# Patient Record
Sex: Female | Born: 1950 | Race: White | Hispanic: No | Marital: Married | Smoking: Never smoker
Health system: Southern US, Community
[De-identification: ages and names within clinical notes are randomized; demographics above are authoritative.]

## PROBLEM LIST (undated history)

## (undated) DIAGNOSIS — E785 Hyperlipidemia, unspecified: Secondary | ICD-10-CM

## (undated) DIAGNOSIS — N951 Menopausal and female climacteric states: Secondary | ICD-10-CM

## (undated) DIAGNOSIS — E559 Vitamin D deficiency, unspecified: Secondary | ICD-10-CM

## (undated) DIAGNOSIS — I10 Essential (primary) hypertension: Secondary | ICD-10-CM

## (undated) DIAGNOSIS — M8580 Other specified disorders of bone density and structure, unspecified site: Secondary | ICD-10-CM

## (undated) DIAGNOSIS — S82002A Unspecified fracture of left patella, initial encounter for closed fracture: Secondary | ICD-10-CM

## (undated) HISTORY — DX: Vitamin D deficiency, unspecified: E55.9

## (undated) HISTORY — DX: Hyperlipidemia, unspecified: E78.5

## (undated) HISTORY — PX: FEMUR FRACTURE SURGERY: SHX633

## (undated) HISTORY — DX: Other specified disorders of bone density and structure, unspecified site: M85.80

## (undated) HISTORY — DX: Essential (primary) hypertension: I10

## (undated) HISTORY — PX: HIP FRACTURE SURGERY: SHX118

## (undated) HISTORY — DX: Unspecified fracture of left patella, initial encounter for closed fracture: S82.002A

## (undated) HISTORY — PX: OTHER SURGICAL HISTORY: SHX169

## (undated) HISTORY — DX: Menopausal and female climacteric states: N95.1

---

## 2005-04-15 ENCOUNTER — Ambulatory Visit: Payer: Self-pay | Admitting: Internal Medicine

## 2005-04-23 ENCOUNTER — Other Ambulatory Visit: Payer: Self-pay

## 2005-04-23 ENCOUNTER — Inpatient Hospital Stay: Payer: Self-pay | Admitting: Unknown Physician Specialty

## 2005-09-30 ENCOUNTER — Ambulatory Visit: Payer: Self-pay | Admitting: Unknown Physician Specialty

## 2005-11-16 ENCOUNTER — Ambulatory Visit: Payer: Self-pay | Admitting: Unknown Physician Specialty

## 2006-06-02 ENCOUNTER — Ambulatory Visit: Payer: Self-pay | Admitting: Unknown Physician Specialty

## 2007-04-01 ENCOUNTER — Ambulatory Visit: Payer: Self-pay | Admitting: Physician Assistant

## 2007-07-03 ENCOUNTER — Ambulatory Visit: Payer: Self-pay | Admitting: Unknown Physician Specialty

## 2007-07-23 ENCOUNTER — Encounter: Payer: Self-pay | Admitting: Unknown Physician Specialty

## 2007-08-15 ENCOUNTER — Encounter: Payer: Self-pay | Admitting: Unknown Physician Specialty

## 2007-09-15 ENCOUNTER — Encounter: Payer: Self-pay | Admitting: Unknown Physician Specialty

## 2007-10-13 ENCOUNTER — Encounter: Payer: Self-pay | Admitting: Unknown Physician Specialty

## 2007-11-13 ENCOUNTER — Encounter: Payer: Self-pay | Admitting: Unknown Physician Specialty

## 2012-08-14 HISTORY — PX: COLONOSCOPY: SHX174

## 2012-09-09 LAB — HM COLONOSCOPY

## 2013-05-09 LAB — HM DEXA SCAN

## 2013-05-16 LAB — HM MAMMOGRAPHY

## 2014-08-31 ENCOUNTER — Encounter: Payer: Self-pay | Admitting: Podiatry

## 2014-08-31 ENCOUNTER — Ambulatory Visit (INDEPENDENT_AMBULATORY_CARE_PROVIDER_SITE_OTHER): Payer: BLUE CROSS/BLUE SHIELD

## 2014-08-31 ENCOUNTER — Ambulatory Visit (INDEPENDENT_AMBULATORY_CARE_PROVIDER_SITE_OTHER): Payer: BLUE CROSS/BLUE SHIELD | Admitting: Podiatry

## 2014-08-31 VITALS — BP 134/82 | HR 79 | Resp 16 | Ht 62.0 in | Wt 137.0 lb

## 2014-08-31 DIAGNOSIS — M722 Plantar fascial fibromatosis: Secondary | ICD-10-CM

## 2014-08-31 MED ORDER — MELOXICAM 15 MG PO TABS
15.0000 mg | ORAL_TABLET | Freq: Every day | ORAL | Status: DC
Start: 2014-08-31 — End: 2014-12-21

## 2014-08-31 MED ORDER — METHYLPREDNISOLONE (PAK) 4 MG PO TABS: ORAL_TABLET | ORAL | Status: DC

## 2014-08-31 NOTE — Progress Notes (Signed)
   Subjective:    Patient ID: Carmen Clarke, female    DOB: 18-Dec-1950, 64 y.o.   MRN: [id number]  HPI Comments: Heel pain in the left foot since the end of September , thought it was due to walking on the treadmill so i stopped that and it has not got any better , it is starting to effect the way i walk      Review of Systems  Musculoskeletal:       Joint pain   All other systems reviewed and are negative.      Objective:   Physical Exam : I have reviewed her past medical history medications allergy surgery social history and review of systems. Pulses are strongly palpable bilateral neurologic sensorium is intact percent was C monofilament. Deep tendon reflexes are intact bilaterally muscle strength is 5 over 5 dorsiflexion plantar flexors and inverters everters all into the musculature is intact. Orthopedic evaluation is resolved with system to the ankle for range of motion or crepitation. Pain on palpation medial calcaneal tubercle of the left heel is indicative of plantar fasciitis. As is the soft tissue increase in density at plantar fascia calcaneal insertion site.       Assessment & Plan:  Assessment: Plantar fasciitis left.  Plan: Started her on a Medrol Dosepak to be followed by meloxicam. Injected the left heel today. Placed her in a plantar fascial brace and a plantar fascial night splint. Discussed appropriate shoe gear stretching exercises and ice therapy. Injected the left heel today with Kenalog and local anesthetic follow-up with her 1 month

## 2014-08-31 NOTE — Patient Instructions (Signed)

## 2014-09-28 ENCOUNTER — Ambulatory Visit: Payer: BLUE CROSS/BLUE SHIELD | Admitting: Podiatry

## 2014-10-07 ENCOUNTER — Ambulatory Visit (INDEPENDENT_AMBULATORY_CARE_PROVIDER_SITE_OTHER): Payer: BLUE CROSS/BLUE SHIELD | Admitting: Podiatry

## 2014-10-07 ENCOUNTER — Encounter: Payer: Self-pay | Admitting: Podiatry

## 2014-10-07 DIAGNOSIS — M722 Plantar fascial fibromatosis: Secondary | ICD-10-CM

## 2014-10-07 NOTE — Progress Notes (Signed)
She presents today for follow-up of her plantar fasciitis to her left heel. She states that she is approximately 98% improved. She is also concerned about bunion to her left foot.  Objective: Vital signs are stable she is alert and oriented 3. She states that she has gone on to purchase a a new pair of shoes which is made a significant difference in her ambulation. She continues to wear her night splint per day brace and continue to take her anti-inflammatories.  Assessment: Well-healing plantar fasciitis left foot. Hallux valgus deformity left foot. With contracted second metatarsophalangeal joint left foot.  Plan: Discussed etiology pathology conservative versus surgical therapies for the hallux valgus deformity and hammertoe deformity second left. We also discussed the possible need for an endoscopic fasciotomy should be fasciitis ever recur. At this point I encouraged her to continue all conservative therapies and this should resolve her symptoms. I did suggest that she continue to change shoe gear approximately every 4-5 months.

## 2014-11-02 ENCOUNTER — Ambulatory Visit (INDEPENDENT_AMBULATORY_CARE_PROVIDER_SITE_OTHER): Payer: BLUE CROSS/BLUE SHIELD | Admitting: Podiatry

## 2014-11-02 VITALS — BP 139/83 | HR 79 | Resp 16

## 2014-11-02 DIAGNOSIS — M722 Plantar fascial fibromatosis: Secondary | ICD-10-CM | POA: Diagnosis not present

## 2014-11-02 NOTE — Progress Notes (Signed)
She presents today for follow-up of her plantar fasciitis. She states that over did it the other day with the treadmill. She states that now back to feeling 90% better however the treadmill really bothered my foot. It felt like it did when I first came to see you. But it did resolve once again.  Objective: Vital signs are stable she is alert and oriented 3 no reproducible pain to the left foot today.  Assessment: Plantar fasciitis  Plan: Continue all conservative therapies.

## 2014-11-25 ENCOUNTER — Ambulatory Visit
Admit: 2014-11-25 | Disposition: A | Discharge status: Home / Self Care | Payer: Self-pay | Attending: Family Medicine | Admitting: Family Medicine

## 2014-12-21 ENCOUNTER — Ambulatory Visit (INDEPENDENT_AMBULATORY_CARE_PROVIDER_SITE_OTHER): Payer: BLUE CROSS/BLUE SHIELD | Admitting: Podiatry

## 2014-12-21 ENCOUNTER — Encounter: Payer: Self-pay | Admitting: Podiatry

## 2014-12-21 VITALS — BP 113/72 | HR 75 | Resp 16

## 2014-12-21 DIAGNOSIS — M722 Plantar fascial fibromatosis: Secondary | ICD-10-CM | POA: Diagnosis not present

## 2014-12-22 NOTE — Progress Notes (Signed)
She presents today for follow-up of her plantar fasciitis left foot. She states that it started hurting again.  Objective: Vital signs are stable she's alert and oriented 3. She is a patient may continue to removal of the left heel. Pulses are palpable pain.  Assessment: Intractable plantar fasciitis left foot.  Plan: Discussed etiology pathology conservative or surgical therapies discussed appropriate shoe gear stretching exercises ice therapy sugar modifications and nonsteroidal anti-inflammatory use. We also injected the left heel today with Kenalog and local anesthetic I will follow-up with her 1 month.

## 2015-01-20 ENCOUNTER — Ambulatory Visit: Payer: BLUE CROSS/BLUE SHIELD | Admitting: Podiatry

## 2015-01-25 ENCOUNTER — Ambulatory Visit (INDEPENDENT_AMBULATORY_CARE_PROVIDER_SITE_OTHER): Payer: BLUE CROSS/BLUE SHIELD | Admitting: Podiatry

## 2015-01-25 DIAGNOSIS — M722 Plantar fascial fibromatosis: Secondary | ICD-10-CM

## 2015-01-25 MED ORDER — DICLOFENAC SODIUM 75 MG PO TBEC: 75.0000 mg | DELAYED_RELEASE_TABLET | Freq: Two times a day (BID) | ORAL | Status: DC

## 2015-01-25 NOTE — Progress Notes (Signed)
She presents today for follow-up of her plantar fasciitis. She states that this point is not getting any better and seems to be worse than it was initially.  Objective: Vital signs are stable she is alert and oriented 3 pulses are palpable left. No Pain. She has pain on palpation medial calcaneal tubercle of the left heel.  Assessment: Chronic intractable plantar fasciitis left heel.  Plan: Injected her left heel today changed her meloxicam to diclofenac and she was scanned for some orthotics.

## 2015-01-29 ENCOUNTER — Encounter: Payer: Self-pay | Admitting: *Deleted

## 2015-01-29 NOTE — Progress Notes (Signed)
Received fax from [facility] regarding pt medications-patient has D/C meloxicam and taking diclofenac currently. Information was completed and faxed back.

## 2015-02-04 ENCOUNTER — Ambulatory Visit: Payer: Self-pay

## 2015-02-09 ENCOUNTER — Ambulatory Visit: Payer: Self-pay

## 2015-02-16 ENCOUNTER — Ambulatory Visit: Payer: Self-pay

## 2015-03-08 ENCOUNTER — Ambulatory Visit: Payer: BLUE CROSS/BLUE SHIELD | Admitting: Podiatry

## 2015-03-11 ENCOUNTER — Ambulatory Visit (INDEPENDENT_AMBULATORY_CARE_PROVIDER_SITE_OTHER): Payer: BLUE CROSS/BLUE SHIELD | Admitting: *Deleted

## 2015-03-11 DIAGNOSIS — M722 Plantar fascial fibromatosis: Secondary | ICD-10-CM

## 2015-03-11 NOTE — Progress Notes (Signed)
Orthotics dispensed.   Instructions given. Recheck in 1 month, if needed.

## 2015-03-11 NOTE — Patient Instructions (Signed)

## 2015-03-15 ENCOUNTER — Encounter: Payer: Self-pay | Admitting: Podiatry

## 2015-03-15 ENCOUNTER — Ambulatory Visit (INDEPENDENT_AMBULATORY_CARE_PROVIDER_SITE_OTHER): Payer: BLUE CROSS/BLUE SHIELD | Admitting: Podiatry

## 2015-03-15 VITALS — BP 126/68 | HR 72 | Resp 16

## 2015-03-15 DIAGNOSIS — M722 Plantar fascial fibromatosis: Secondary | ICD-10-CM | POA: Diagnosis not present

## 2015-03-15 DIAGNOSIS — M779 Enthesopathy, unspecified: Secondary | ICD-10-CM

## 2015-03-15 DIAGNOSIS — M778 Other enthesopathies, not elsewhere classified: Secondary | ICD-10-CM

## 2015-03-15 DIAGNOSIS — M7752 Other enthesopathy of left foot: Secondary | ICD-10-CM | POA: Diagnosis not present

## 2015-03-15 HISTORY — PX: FOOT SURGERY: SHX648

## 2015-03-15 MED ORDER — METHYLPREDNISOLONE 4 MG PO TBPK: ORAL_TABLET | ORAL | Status: DC

## 2015-03-15 NOTE — Progress Notes (Signed)
She presents today for follow-up of her plantar fasciitis left. She states that she's having some sharp pains across the dorsal aspect and the dorsal lateral aspect of her left foot.  Objective: Vital signs are stable she is alert and oriented 3 pulses are palpable left. Pain on palpation medial calcaneal tubercle of the left heel. Pain on palpation of the dorsal lateral aspect of the left foot overlying the fourth and fifth met cuboid articulation.  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  Plan: Injected the dorsal lateral aspect of left foot overlying the fourth and fifth met cuboid articulation. She will continue all other conservative therapies for the plantar fasciitis.

## 2015-04-05 ENCOUNTER — Encounter: Payer: Self-pay | Admitting: Podiatry

## 2015-04-05 ENCOUNTER — Ambulatory Visit (INDEPENDENT_AMBULATORY_CARE_PROVIDER_SITE_OTHER): Payer: BLUE CROSS/BLUE SHIELD | Admitting: Podiatry

## 2015-04-05 VITALS — BP 142/96 | HR 78 | Resp 12

## 2015-04-05 DIAGNOSIS — M722 Plantar fascial fibromatosis: Secondary | ICD-10-CM | POA: Diagnosis not present

## 2015-04-05 NOTE — Progress Notes (Signed)
She presents today for surgical consult. She states that she recently returned from [place]. She states that the last injection and Medrol Dosepak really didn't seem to help very much at all. She denies changes in her past medical history medications allergies surgery or social history. Continued pain to the plantar left heel as well as to the plantar lateral aspect and dorsal lateral aspect of the left foot.  Objective: Vital signs are stable she is alert and oriented 3. Pulses are palpable left foot. No calf pain. Muscle strength is normal left. Neurologic sensorium is intact left. She has pain all frontal plane range of motion over Lisfranc's joints particularly overlying the fourth and fifth metatarsal cuboid articulation. She also has pain on palpation medial calcaneal tubercle of the left heel. Previous radiographs taken in the office were reevaluated today demonstrating at the time soft tissue increase in density at the plantar fascial calcaneal insertion site. I see no signs of other complications.  Assessment: 64 year old female with a history of chronic proximal plantar fasciitis and lateral compensatory syndrome worsening.  Plan: We discussed the etiology pathology conservative versus surgical therapies. At this point she is requesting surgical intervention. We went over a consent form today line by line number by number giving her ample time to ask questions she saw fit regarding an endoscopic plantar fasciotomy to the left foot. I answered all the questions regarding these procedures to the best of my ability in layman's terms. She understood it was amenable to it and sign off 3 pages of the consent form. We did discuss the possible postop complications which may include but are not limited to postoperative pain bleeding swelling infection recurrence and need for further surgery. Cam Walker was dispensed today for her postop recovery. Should she have questions or concerns regarding these  procedures she will notify us immediately.

## 2015-04-05 NOTE — Patient Instructions (Signed)
Pre-Operative Instructions  Congratulations, you have decided to take an important step to improving your quality of life.  You can be assured that the doctors of [facility] will be with you every step of the way.  1. Plan to be at the surgery center/hospital at least 1 (one) hour prior to your scheduled time unless otherwise directed by the surgical center/hospital staff.  You must have a responsible adult accompany you, remain during the surgery and drive you home.  Make sure you have directions to the surgical center/hospital and know how to get there on time. 2. For hospital based surgery you will need to obtain a history and physical form from your family physician within 1 month prior to the date of surgery- we will give you a form for you primary physician.  3. We make every effort to accommodate the date you request for surgery.  There are however, times where surgery dates or times have to be moved.  We will contact you as soon as possible if a change in schedule is required.   4. No Aspirin/Ibuprofen for one week before surgery.  If you are on aspirin, any non-steroidal anti-inflammatory medications (Mobic, Aleve, Ibuprofen) you should stop taking it 7 days prior to your surgery.  You make take Tylenol  For pain prior to surgery.  5. Medications- If you are taking daily heart and blood pressure medications, seizure, reflux, allergy, asthma, anxiety, pain or diabetes medications, make sure the surgery center/hospital is aware before the day of surgery so they may notify you which medications to take or avoid the day of surgery. 6. No food or drink after midnight the night before surgery unless directed otherwise by surgical center/hospital staff. 7. No alcoholic beverages 24 hours prior to surgery.  No smoking 24 hours prior to or 24 hours after surgery. 8. Wear loose pants or shorts- loose enough to fit over bandages, boots, and casts. 9. No slip on shoes, sneakers are best. 10. Bring  your boot with you to the surgery center/hospital.  Also bring crutches or a walker if your physician has prescribed it for you.  If you do not have this equipment, it will be provided for you after surgery. 11. If you have not been contracted by the surgery center/hospital by the day before your surgery, call to confirm the date and time of your surgery. 12. Leave-time from work may vary depending on the type of surgery you have.  Appropriate arrangements should be made prior to surgery with your employer. 13. Prescriptions will be provided immediately following surgery by your doctor.  Have these filled as soon as possible after surgery and take the medication as directed. 14. Remove nail polish on the operative foot. 15. Wash the night before surgery.  The night before surgery wash the foot and leg well with the antibacterial soap provided and water paying special attention to beneath the toenails and in between the toes.  Rinse thoroughly with water and dry well with a towel.  Perform this wash unless told not to do so by your physician.  Enclosed: 1 Ice pack (please put in freezer the night before surgery)   1 Hibiclens skin cleaner   Pre-op Instructions  If you have any questions regarding the instructions, do not hesitate to call our office.  [place]: [address] [place], [place] [address] [phone]  [place]: [address] [place], [place] [address] [phone]  [place]: [address]  [place], [place] [address] [phone]  Dr. Richard   Tuchman DPM, Dr. Norman Regal DPM Dr. Richard Sikora DPM, Dr. M. Todd Hyatt DPM, Dr. Kathryn Egerton DPM 

## 2015-04-07 ENCOUNTER — Telehealth: Payer: Self-pay | Admitting: *Deleted

## 2015-04-07 NOTE — Telephone Encounter (Signed)
"  I was in the [place] office Monday to see Dr.Hyatt.  He discussed surgery with me for my left heel.  I filled out the on-line registration Monday but I didn't know whether I was to wait until you called me or if I'm supposed to call you.  Anyway, I'm just trying to find out what I'm supposed to do now and get my surgery scheduled.  Call me back after you get this message.  After 1pm, call me on my cell phone.  Thank you."  I attempted to call patient at home number.  I left her a message to call me back.  I called her mobile and got her.  I'm returning your call to schedule surgery.  When would you like to do it?  "I'd like to do it as soon as possible because I have a trip planned to go to the beach in October."  He can do it on 04/16/2015.  "That will be perfect."  Surgical center will call you a day or two prior to and give you arrival time.  "Okay, thank you."

## 2015-04-12 ENCOUNTER — Telehealth: Payer: Self-pay | Admitting: *Deleted

## 2015-04-12 NOTE — Telephone Encounter (Addendum)
Pt states she forgot to ask if she were to stop the antiinflammatory medication prior to her surgery 04/16/2015, but she went ahead and did. I told pt it would be fine to stay off the antiinflammatory, I would advise Dr. Milinda Pointer and if changes call again.  Left message informing pt, Dr. Milinda Pointer stated okay for stopping the antiinflammatory medication.

## 2015-04-12 NOTE — Telephone Encounter (Signed)
Ok....it really doesn't matter.

## 2015-04-15 ENCOUNTER — Other Ambulatory Visit: Payer: Self-pay | Admitting: Podiatry

## 2015-04-15 MED ORDER — OXYCODONE-ACETAMINOPHEN 10-325 MG PO TABS: 1.0000 | ORAL_TABLET | Freq: Four times a day (QID) | ORAL | Status: DC | PRN

## 2015-04-15 MED ORDER — PROMETHAZINE HCL 25 MG PO TABS: 25.0000 mg | ORAL_TABLET | Freq: Three times a day (TID) | ORAL | Status: DC | PRN

## 2015-04-15 MED ORDER — CEPHALEXIN 500 MG PO CAPS: 500.0000 mg | ORAL_CAPSULE | Freq: Three times a day (TID) | ORAL | Status: DC

## 2015-04-16 ENCOUNTER — Encounter: Payer: Self-pay | Admitting: *Deleted

## 2015-04-16 DIAGNOSIS — M722 Plantar fascial fibromatosis: Secondary | ICD-10-CM | POA: Diagnosis not present

## 2015-04-21 ENCOUNTER — Telehealth: Payer: Self-pay | Admitting: *Deleted

## 2015-04-21 ENCOUNTER — Ambulatory Visit (INDEPENDENT_AMBULATORY_CARE_PROVIDER_SITE_OTHER): Payer: BLUE CROSS/BLUE SHIELD | Admitting: Podiatry

## 2015-04-21 ENCOUNTER — Encounter: Payer: Self-pay | Admitting: Podiatry

## 2015-04-21 VITALS — BP 138/78 | HR 88 | Resp 16

## 2015-04-21 DIAGNOSIS — M722 Plantar fascial fibromatosis: Secondary | ICD-10-CM

## 2015-04-21 DIAGNOSIS — Z9889 Other specified postprocedural states: Secondary | ICD-10-CM

## 2015-04-21 NOTE — Progress Notes (Signed)
Surgery performed at [facility] for an Endoscopic Plantar Fasciotomy left foot.

## 2015-04-21 NOTE — Progress Notes (Signed)
She presents today 1 week status post endoscopic plantar fasciotomy left foot. She states that I have had very little pain. She states that the local anesthesia a pain block lasted 26 hours. She denies fever chills nausea vomiting muscle aches and pains.  Objective: Vital signs are stable she is alert and oriented 3. Pulses are strongly palpable. Dry sterile dressing was removed demonstrates sutures are in place margins well coapted there is no bleeding on the bandage. I see no signs of infection in that there is no erythema edema cellulitis drainage or odor.  Assessment: Well-healing surgical foot left status post EPF 1 week.  Plan: Encouraged range of motion exercises however she is to continue to keep her foot in the cam walker as much as possible. I redressed today with simple Band-Aids and a well will allow her to get this wet be a shower and then soak in Epsom salts and warm water afterwards. I will follow-up with her in 1 week a compression anklet was dispensed.

## 2015-04-21 NOTE — Telephone Encounter (Signed)
I attempted to call patient to see how she was doing following surgery.  I left her a message that she can call me back if she likes.

## 2015-04-22 ENCOUNTER — Telehealth: Payer: Self-pay | Admitting: *Deleted

## 2015-04-22 ENCOUNTER — Encounter: Payer: Self-pay | Admitting: Podiatry

## 2015-04-22 NOTE — Telephone Encounter (Signed)
ok 

## 2015-04-22 NOTE — Telephone Encounter (Signed)
Pt states at [place] Dr. Milinda Pointer applied compression sock, and she soaked the foot then put the compression sock BACK ON, at 100am she began to have excruciating pain in the heel, and soaked again this morning but left the compression sock off.  I instructed pt to leave off the compression sock, but to apply an ace wrap from toes to up calf to decrease any swelling and to begin training the foot not to swell during the day, then begin the soak after her evening bath and NOT put on the compression sock, UNTIL FIRST thing in the morning before swinging leg over the edge of the bed.  I told pt to continue to sleep in the boot until otherwise directed, and to wash the compression sock and white stockingette at night and air dry.

## 2015-04-28 ENCOUNTER — Ambulatory Visit (INDEPENDENT_AMBULATORY_CARE_PROVIDER_SITE_OTHER): Payer: BLUE CROSS/BLUE SHIELD | Admitting: Podiatry

## 2015-04-28 ENCOUNTER — Encounter: Payer: Self-pay | Admitting: Podiatry

## 2015-04-28 VITALS — BP 136/79 | HR 76 | Resp 16

## 2015-04-28 DIAGNOSIS — Z9889 Other specified postprocedural states: Secondary | ICD-10-CM

## 2015-04-28 DIAGNOSIS — M722 Plantar fascial fibromatosis: Secondary | ICD-10-CM

## 2015-04-28 NOTE — Progress Notes (Signed)
She presents today for follow-up of her endoscopic plantar fasciotomy of her left foot. She states has been very tender. She is 2 weeks now status post EPF left. Sutures are intact and margins are well coapted.  Objective: Tenderness on palpation medial calcaneal tubercle minimal edema no erythema cellulitis drainage or odor. Sutures are intact and margins are well coapted we removed the sutures today no signs of purulence and no drainage.  Assessment: Well-healing surgical foot left.  Plan: Discontinue use of the anklet. Discontinue use of the Cam Walker during the day. Start utilizing her tennis shoes with her insoles. She will continue the use of the night splint 1 month. I will follow-up with her in 2 weeks.

## 2015-05-07 DIAGNOSIS — E785 Hyperlipidemia, unspecified: Secondary | ICD-10-CM | POA: Insufficient documentation

## 2015-05-07 DIAGNOSIS — M8580 Other specified disorders of bone density and structure, unspecified site: Secondary | ICD-10-CM | POA: Insufficient documentation

## 2015-05-07 DIAGNOSIS — E559 Vitamin D deficiency, unspecified: Secondary | ICD-10-CM | POA: Insufficient documentation

## 2015-05-07 DIAGNOSIS — I10 Essential (primary) hypertension: Secondary | ICD-10-CM | POA: Insufficient documentation

## 2015-05-12 ENCOUNTER — Encounter: Payer: Self-pay | Admitting: Podiatry

## 2015-05-12 ENCOUNTER — Ambulatory Visit (INDEPENDENT_AMBULATORY_CARE_PROVIDER_SITE_OTHER): Payer: BLUE CROSS/BLUE SHIELD | Admitting: Podiatry

## 2015-05-12 VITALS — BP 128/92 | HR 88 | Resp 18

## 2015-05-12 DIAGNOSIS — M722 Plantar fascial fibromatosis: Secondary | ICD-10-CM

## 2015-05-12 DIAGNOSIS — Z9889 Other specified postprocedural states: Secondary | ICD-10-CM

## 2015-05-12 DIAGNOSIS — L309 Dermatitis, unspecified: Secondary | ICD-10-CM

## 2015-05-12 NOTE — Progress Notes (Signed)
She presents today 4 weeks status post endoscopic plantar fasciotomy left foot. Initially the foot felt much better and now she states that her stiffness is better as it did prior to surgery. She states this seems to hurt worse right incision site. She relates that she's been rubbing and massaging the foot with a [employer] hand cream and now has started to develop a rash and peeling the plantar aspect of the left foot only. She states the small red rash on the second toe of the left foot.  Objective: Vital signs are stable she is alert and oriented 3 pulses are palpable bilateral. As to the rash she has a rash majority plantar aspect of the left foot however she does have a rash with peeling starting on the plantar aspect of the right foot more than likely this is associated with the the lotion that she is putting on the foot because it's primarily on the left. That she does that she puts it on the right on occasions as well. She also demonstrates some petechial type lesions much like an athlete's foot more on the left than on the right. This also could be another etiology. She has tenderness on palpation of medial calcaneal tubercle of the left foot but there is still some scarring present and the majority of the symptoms are at the level of the incision site. She states that she took her orthotics out of her tennis shoes.  Assessment: Endoscopic plantar fasciotomy 1 month ago. Tinea pedis with contact dermatitis bilateral foot. Residual pain status post EPF left.  Plan: I encouraged her to continue to rub the foot however use a regular hand cream I encouraged her to stop using the cream that she was using and start utilizing a Lamisil AT cream twice daily. I encouraged her to continue her diclofenac 75 mg twice daily. Follow-up with her in 2-4 weeks  Carmen Clarke DPM

## 2015-05-14 ENCOUNTER — Ambulatory Visit (INDEPENDENT_AMBULATORY_CARE_PROVIDER_SITE_OTHER): Payer: BLUE CROSS/BLUE SHIELD | Admitting: Family Medicine

## 2015-05-14 ENCOUNTER — Encounter: Payer: Self-pay | Admitting: Family Medicine

## 2015-05-14 VITALS — BP 129/83 | HR 68 | Temp 98.2°F | Ht 62.0 in | Wt 140.0 lb

## 2015-05-14 DIAGNOSIS — E559 Vitamin D deficiency, unspecified: Secondary | ICD-10-CM

## 2015-05-14 DIAGNOSIS — Z23 Encounter for immunization: Secondary | ICD-10-CM | POA: Diagnosis not present

## 2015-05-14 DIAGNOSIS — M8580 Other specified disorders of bone density and structure, unspecified site: Secondary | ICD-10-CM | POA: Diagnosis not present

## 2015-05-14 DIAGNOSIS — I10 Essential (primary) hypertension: Secondary | ICD-10-CM

## 2015-05-14 DIAGNOSIS — E785 Hyperlipidemia, unspecified: Secondary | ICD-10-CM

## 2015-05-14 DIAGNOSIS — Z0000 Encounter for general adult medical examination without abnormal findings: Secondary | ICD-10-CM | POA: Diagnosis not present

## 2015-05-14 DIAGNOSIS — E663 Overweight: Secondary | ICD-10-CM | POA: Insufficient documentation

## 2015-05-14 DIAGNOSIS — Z1239 Encounter for other screening for malignant neoplasm of breast: Secondary | ICD-10-CM | POA: Diagnosis not present

## 2015-05-14 NOTE — Assessment & Plan Note (Signed)
Order DEXA; continue vit D supplementation and adequate calcium intake 1000 to 1200 mg daily; fall precautions

## 2015-05-14 NOTE — Progress Notes (Signed)
Patient ID: Carmen Clarke, female   DOB: 1950/08/24, 64 y.o.   MRN: [id number]  Subjective:   Carmen Clarke is a 64 y.o. female here for a complete physical exam  Interim issues since last visit: had foot surgery, Dr. Milinda Pointer  USPSTF grade A and B recommendations Alcohol: n/a Depression screen [facility] 2/9 05/14/2015  Decreased Interest 0  Down, Depressed, Hopeless 0  PHQ - 2 Score 0  HTN: controlled Obesity: just 0.6 above 25 (overweight); weight gain 5 pounds over last 6 months; foot surgery Tobacco: n/a HIV, hep B, hep C: politely declined BRCA: n/a Abuse: no Cervical cancer screening: UTD Lipids and glucose: today Colonoscopy: jan 2014, patient thinks 10 year pass Breast cancer: mammo order Lung ca: n/a Osteoporosis: DEXA ordered AAA: n/a Aspirin: not currently taking Fall prevention: dicscussed Diet: not typical American eater; fried chicken once every 3 months, takes the skin off; not much cheese; eats steak, red meat less than 2x a week Exercise: had foot surgery, does what she can Skin cancer: avoid excessive sun exposure  Past Medical History  Diagnosis Date  . Hypertension   . Hyperlipidemia   . Osteopenia   . Vitamin D deficiency disease   . Menopausal symptoms     previous HRT   Past Surgical History  Procedure Laterality Date  . Femur fracture surgery    . Hip fracture surgery    . Knee arthoscopy Left     partial medial and lateral meniscetomy with removal of deep hardware from left distal femur  . Foot surgery  Aug. 2016   Family History  Problem Relation Age of Onset  . Diabetes Mother   . Hypertension Mother   . Hypertension Father   No thyroid disease in the family  Social History  Substance Use Topics  . Smoking status: Never Smoker   . Smokeless tobacco: Never Used  . Alcohol Use: No   Review of Systems  Constitutional: Positive for unexpected weight change (five pounds over last 6 months).  HENT: Negative for hearing loss.   Eyes:  Negative for visual disturbance.  Respiratory: Negative for shortness of breath.   Cardiovascular: Negative for chest pain.  Gastrointestinal: Negative for constipation and blood in stool.  Endocrine: Positive for heat intolerance. Negative for polydipsia.       Still has occasional hot flashes  Genitourinary: Negative for hematuria.  Musculoskeletal:       Foot pain  Skin:       No hair loss; no dry skin; no moles have changed; sees derm, checks her thoroughly she says  Allergic/Immunologic: Negative for food allergies.  Neurological: Negative for tremors.  Hematological: Negative for adenopathy. Does not bruise/bleed easily.  Psychiatric/Behavioral: Negative for dysphoric mood.    Objective:   Filed Vitals:   05/14/15 0853  BP: 129/83  Pulse: 68  Temp: 98.2 F (36.8 C)  Height: _0  (1.575 m)  Weight: 140 lb (63.504 kg)  SpO2: 100%   Body mass index is 25.6 kg/(m^2). Wt Readings from Last 3 Encounters:  05/14/15 140 lb (63.504 kg)  12/18/14 139 lb (63.05 kg)  08/31/14 137 lb (62.143 kg)   Physical Exam  Constitutional: She appears well-developed and well-nourished.  HENT:  Head: Normocephalic and atraumatic.  Right Ear: Hearing, tympanic membrane, external ear and ear canal normal.  Left Ear: Hearing, tympanic membrane, external ear and ear canal normal.  Eyes: Conjunctivae and EOM are normal. Right eye exhibits no hordeolum. Left eye exhibits no hordeolum. No  scleral icterus.  Neck: Carotid bruit is not present. No thyromegaly present.  Cardiovascular: Normal rate, regular rhythm, S1 normal, S2 normal and normal heart sounds.   No extrasystoles are present.  Pulmonary/Chest: Effort normal and breath sounds normal. No respiratory distress. Right breast exhibits no inverted nipple, no mass, no nipple discharge, no skin change and no tenderness. Left breast exhibits no inverted nipple, no mass, no nipple discharge, no skin change and no tenderness. Breasts are  symmetrical.  Abdominal: Soft. Normal appearance and bowel sounds are normal. She exhibits no distension, no abdominal bruit, no pulsatile midline mass and no mass. There is no hepatosplenomegaly. There is no tenderness. No hernia.  Musculoskeletal: Normal range of motion. She exhibits no edema.  Lymphadenopathy:       Head (right side): No submandibular adenopathy present.       Head (left side): No submandibular adenopathy present.    She has no cervical adenopathy.    She has no axillary adenopathy.  Neurological: She is alert. She displays no tremor. No cranial nerve deficit. She exhibits normal muscle tone. Gait normal.  Reflex Scores:      Patellar reflexes are 2+ on the right side and 2+ on the left side. Skin: Skin is warm and dry. No bruising and no ecchymosis noted. No cyanosis. No pallor.  Psychiatric: Her speech is normal and behavior is normal. Thought content normal. Her mood appears not anxious. She does not exhibit a depressed mood.    Assessment/Plan:   Problem List Items Addressed This Visit      Cardiovascular and Mediastinum   Hypertension    Continue med; controlled today        Musculoskeletal and Integument   Osteopenia    Order DEXA; continue vit D supplementation and adequate calcium intake 1000 to 1200 mg daily; fall precautions      Relevant Orders   DG Bone Density     Other   Hyperlipidemia    Check lipids, limit saturated fats; continue statin      Vitamin D deficiency disease    Check level today and continue supplement      Relevant Orders   Vit D  25 hydroxy (rtn osteoporosis monitoring) (Completed)   Overweight (BMI 25.0-29.9)    Very modestly overweight; goal BMI is less than 25      Relevant Orders   TSH (Completed)   Breast cancer screening    Mammograms every 1-2 years, SBE monthly, CBE done      Relevant Orders   MM DIGITAL SCREENING BILATERAL   Preventative health care - Primary    USPSTF grade A and B recommendations  reviewed with patient; age-appropriate recommendations, preventive care, screening tests, etc discussed and encouraged; healthy living encouraged; see AVS for patient education given to patient      Relevant Orders   CBC with Differential/Platelet (Completed)   Comprehensive metabolic panel (Completed)   Lipid Panel w/o Chol/HDL Ratio (Completed)   Tdap vaccine greater than or equal to 7yo IM (Completed)    Other Visit Diagnoses    Encounter for immunization        flu vaccine given today       Orders Placed This Encounter  Procedures  . DG Bone Density  . MM DIGITAL SCREENING BILATERAL  . Flu Vaccine QUAD 36+ mos IM  . Tdap vaccine greater than or equal to 7yo IM  . CBC with Differential/Platelet  . Comprehensive metabolic panel  . Lipid Panel w/o Chol/HDL Ratio  .  TSH  . Vit D  25 hydroxy (rtn osteoporosis monitoring)    Meds ordered this encounter  Medications  . Calcium Carbonate-Vit D-Min (CALCIUM 1200) 1200-1000 MG-UNIT CHEW    Sig: Chew 1 capsule by mouth daily.   Follow up plan: Return in about 4 months (around 09/06/2015) for Welcome to Medicare after 65th birthday.  An after-visit summary was printed and given to the patient at [facility].  Please see the patient instructions which may contain other information and recommendations beyond what is mentioned above in the assessment and plan.

## 2015-05-14 NOTE — Patient Instructions (Addendum)
Please do call to schedule your mammogram; the number to schedule one at either [facility] or [facility] is [phone]  We'll contact you about the labs drawn today  If you have not heard anything from my staff in a week about any orders/referrals/studies from today, please contact us here to follow-up [phone]  Start 81 mg coated aspirin daily, take an hour or more BEFORE the diclofenac    Health Maintenance Adopting a healthy lifestyle and getting preventive care can go a long way to promote health and wellness. Talk with your health care provider about what schedule of regular examinations is right for you. This is a good chance for you to check in with your provider about disease prevention and staying healthy. In between checkups, there are plenty of things you can do on your own. Experts have done a lot of research about which lifestyle changes and preventive measures are most likely to keep you healthy. Ask your health care provider for more information. WEIGHT AND DIET  Eat a healthy diet  Be sure to include plenty of vegetables, fruits, low-fat dairy products, and lean protein.  Do not eat a lot of foods high in solid fats, added sugars, or salt.  Get regular exercise. This is one of the most important things you can do for your health.  Most adults should exercise for at least 150 minutes each week. The exercise should increase your heart rate and make you sweat (moderate-intensity exercise).  Most adults should also do strengthening exercises at least twice a week. This is in addition to the moderate-intensity exercise.  Maintain a healthy weight  Body mass index (BMI) is a measurement that can be used to identify possible weight problems. It estimates body fat based on height and weight. Your health care provider can help determine your BMI and help you achieve or maintain a healthy weight.  For females 31 years of age and older:   A  BMI below 18.5 is considered underweight.  A BMI of 18.5 to 24.9 is normal.  A BMI of 25 to 29.9 is considered overweight.  A BMI of 30 and above is considered obese.  Watch levels of cholesterol and blood lipids  You should start having your blood tested for lipids and cholesterol at 64 years of age, then have this test every 5 years.  You may need to have your cholesterol levels checked more often if:  Your lipid or cholesterol levels are high.  You are older than 64 years of age.  You are at high risk for heart disease.  CANCER SCREENING   Lung Cancer  Lung cancer screening is recommended for adults 29-54 years old who are at high risk for lung cancer because of a history of smoking.  A yearly low-dose CT scan of the lungs is recommended for people who:  Currently smoke.  Have quit within the past 15 years.  Have at least a 30-pack-year history of smoking. A pack year is smoking an average of one pack of cigarettes a day for 1 year.  Yearly screening should continue until it has been 15 years since you quit.  Yearly screening should stop if you develop a health problem that would prevent you from having lung cancer treatment.  Breast Cancer  Practice breast self-awareness. This means understanding how your breasts normally appear and feel.  It also means doing regular breast self-exams. Let your health care provider know about any changes, no matter how  small.  If you are in your 20s or 30s, you should have a clinical breast exam (CBE) by a health care provider every 1-3 years as part of a regular health exam.  If you are 40 or older, have a CBE every year. Also consider having a breast X-ray (mammogram) every year.  If you have a family history of breast cancer, talk to your health care provider about genetic screening.  If you are at high risk for breast cancer, talk to your health care provider about having an MRI and a mammogram every year.  Breast cancer  gene (BRCA) assessment is recommended for women who have family members with BRCA-related cancers. BRCA-related cancers include:  Breast.  Ovarian.  Tubal.  Peritoneal cancers.  Results of the assessment will determine the need for genetic counseling and BRCA1 and BRCA2 testing. Cervical Cancer Routine pelvic examinations to screen for cervical cancer are no longer recommended for nonpregnant women who are considered low risk for cancer of the pelvic organs (ovaries, uterus, and vagina) and who do not have symptoms. A pelvic examination may be necessary if you have symptoms including those associated with pelvic infections. Ask your health care provider if a screening pelvic exam is right for you.   The Pap test is the screening test for cervical cancer for women who are considered at risk.  If you had a hysterectomy for a problem that was not cancer or a condition that could lead to cancer, then you no longer need Pap tests.  If you are older than 65 years, and you have had normal Pap tests for the past 10 years, you no longer need to have Pap tests.  If you have had past treatment for cervical cancer or a condition that could lead to cancer, you need Pap tests and screening for cancer for at least 20 years after your treatment.  If you no longer get a Pap test, assess your risk factors if they change (such as having a new sexual partner). This can affect whether you should start being screened again.  Some women have medical problems that increase their chance of getting cervical cancer. If this is the case for you, your health care provider may recommend more frequent screening and Pap tests.  The human papillomavirus (HPV) test is another test that may be used for cervical cancer screening. The HPV test looks for the virus that can cause cell changes in the cervix. The cells collected during the Pap test can be tested for HPV.  The HPV test can be used to screen women 78 years of age  and older. Getting tested for HPV can extend the interval between normal Pap tests from three to five years.  An HPV test also should be used to screen women of any age who have unclear Pap test results.  After 64 years of age, women should have HPV testing as often as Pap tests.  Colorectal Cancer  This type of cancer can be detected and often prevented.  Routine colorectal cancer screening usually begins at 64 years of age and continues through 64 years of age.  Your health care provider may recommend screening at an earlier age if you have risk factors for colon cancer.  Your health care provider may also recommend using home test kits to check for hidden blood in the stool.  A small camera at the end of a tube can be used to examine your colon directly (sigmoidoscopy or colonoscopy). This is done to  to check for the earliest forms of colorectal cancer.  Routine screening usually begins at age 50.  Direct examination of the colon should be repeated every 5-10 years through 64 years of age. However, you may need to be screened more often if early forms of precancerous polyps or small growths are found. Skin Cancer  Check your skin from head to toe regularly.  Tell your health care provider about any new moles or changes in moles, especially if there is a change in a mole's shape or color.  Also tell your health care provider if you have a mole that is larger than the size of a pencil eraser.  Always use sunscreen. Apply sunscreen liberally and repeatedly throughout the day.  Protect yourself by wearing long sleeves, pants, a wide-brimmed hat, and sunglasses whenever you are outside. HEART DISEASE, DIABETES, AND HIGH BLOOD PRESSURE   Have your blood pressure checked at least every 1-2 years. High blood pressure causes heart disease and increases the risk of stroke.  If you are between 55 years and 79 years old, ask your health care provider if you should take aspirin to prevent  strokes.  Have regular diabetes screenings. This involves taking a blood sample to check your fasting blood sugar level.  If you are at a normal weight and have a low risk for diabetes, have this test once every three years after 64 years of age.  If you are overweight and have a high risk for diabetes, consider being tested at a younger age or more often. PREVENTING INFECTION  Hepatitis B  If you have a higher risk for hepatitis B, you should be screened for this virus. You are considered at high risk for hepatitis B if:  You were born in a country where hepatitis B is common. Ask your health care provider which countries are considered high risk.  Your parents were born in a high-risk country, and you have not been immunized against hepatitis B (hepatitis B vaccine).  You have HIV or AIDS.  You use needles to inject street drugs.  You live with someone who has hepatitis B.  You have had sex with someone who has hepatitis B.  You get hemodialysis treatment.  You take certain medicines for conditions, including cancer, organ transplantation, and autoimmune conditions. Hepatitis C  Blood testing is recommended for:  Everyone born from 1945 through 1965.  Anyone with known risk factors for hepatitis C. Sexually transmitted infections (STIs)  You should be screened for sexually transmitted infections (STIs) including gonorrhea and chlamydia if:  You are sexually active and are younger than 64 years of age.  You are older than 64 years of age and your health care provider tells you that you are at risk for this type of infection.  Your sexual activity has changed since you were last screened and you are at an increased risk for chlamydia or gonorrhea. Ask your health care provider if you are at risk.  If you do not have HIV, but are at risk, it may be recommended that you take a prescription medicine daily to prevent HIV infection. This is called pre-exposure prophylaxis  (PrEP). You are considered at risk if:  You are sexually active and do not regularly use condoms or know the HIV status of your partner(s).  You take drugs by injection.  You are sexually active with a partner who has HIV. Talk with your health care provider about whether you are at high risk of being infected with   If you choose to begin PrEP, you should first be tested for HIV. You should then be tested every 3 months for as long as you are taking PrEP.  PREGNANCY   If you are premenopausal and you may become pregnant, ask your health care provider about preconception counseling.  If you may become pregnant, take 400 to 800 micrograms (mcg) of folic acid every day.  If you want to prevent pregnancy, talk to your health care provider about birth control (contraception). OSTEOPOROSIS AND MENOPAUSE   Osteoporosis is a disease in which the bones lose minerals and strength with aging. This can result in serious bone fractures. Your risk for osteoporosis can be identified using a bone density scan.  If you are 65 years of age or older, or if you are at risk for osteoporosis and fractures, ask your health care provider if you should be screened.  Ask your health care provider whether you should take a calcium or vitamin D supplement to lower your risk for osteoporosis.  Menopause may have certain physical symptoms and risks.  Hormone replacement therapy may reduce some of these symptoms and risks. Talk to your health care provider about whether hormone replacement therapy is right for you.  HOME CARE INSTRUCTIONS   Schedule regular health, dental, and eye exams.  Stay current with your immunizations.   Do not use any tobacco products including cigarettes, chewing tobacco, or electronic cigarettes.  If you are pregnant, do not drink alcohol.  If you are breastfeeding, limit how much and how often you drink alcohol.  Limit alcohol intake to no more than 1 drink per day for  nonpregnant women. One drink equals 12 ounces of beer, 5 ounces of wine, or 1 ounces of hard liquor.  Do not use street drugs.  Do not share needles.  Ask your health care provider for help if you need support or information about quitting drugs.  Tell your health care provider if you often feel depressed.  Tell your health care provider if you have ever been abused or do not feel safe at home. Document Released: 02/13/2011 Document Revised: 12/15/2013 Document Reviewed: 07/02/2013 [facility] Patient Information 2015 [place], [place]. This information is not intended to replace advice given to you by your health care provider. Make sure you discuss any questions you have with your health care provider.

## 2015-05-14 NOTE — Assessment & Plan Note (Signed)
Check level today and continue supplement

## 2015-05-15 LAB — COMPREHENSIVE METABOLIC PANEL
A/G RATIO: 2 (ref 1.1–2.5)
ALBUMIN: 4.5 g/dL (ref 3.6–4.8)
ALT: 21 IU/L (ref 0–32)
AST: 17 IU/L (ref 0–40)
Alkaline Phosphatase: 86 IU/L (ref 39–117)
BUN / CREAT RATIO: 27 — AB (ref 11–26)
BUN: 22 mg/dL (ref 8–27)
Bilirubin Total: 0.3 mg/dL (ref 0.0–1.2)
CALCIUM: 9.9 mg/dL (ref 8.7–10.3)
CO2: 26 mmol/L (ref 18–29)
Chloride: 101 mmol/L (ref 97–108)
Creatinine, Ser: 0.83 mg/dL (ref 0.57–1.00)
GFR, EST AFRICAN AMERICAN: 86 mL/min/{1.73_m2} (ref >59)
GFR, EST NON AFRICAN AMERICAN: 75 mL/min/{1.73_m2} (ref >59)
Globulin, Total: 2.2 g/dL (ref 1.5–4.5)
Glucose: 95 mg/dL (ref 65–99)
POTASSIUM: 3.9 mmol/L (ref 3.5–5.2)
Sodium: 141 mmol/L (ref 134–144)
TOTAL PROTEIN: 6.7 g/dL (ref 6.0–8.5)

## 2015-05-15 LAB — LIPID PANEL W/O CHOL/HDL RATIO
Cholesterol, Total: 178 mg/dL (ref 100–199)
HDL: 90 mg/dL (ref >39)
LDL Calculated: 65 mg/dL (ref 0–99)
Triglycerides: 115 mg/dL (ref 0–149)
VLDL Cholesterol Cal: 23 mg/dL (ref 5–40)

## 2015-05-15 LAB — CBC WITH DIFFERENTIAL/PLATELET
BASOS: 1 %
Basophils Absolute: 0.1 10*3/uL (ref 0.0–0.2)
EOS (ABSOLUTE): 0.3 10*3/uL (ref 0.0–0.4)
EOS: 4 %
Hematocrit: 40.6 % (ref 34.0–46.6)
Hemoglobin: 13.5 g/dL (ref 11.1–15.9)
IMMATURE GRANS (ABS): 0 10*3/uL (ref 0.0–0.1)
IMMATURE GRANULOCYTES: 0 %
LYMPHS: 35 %
Lymphocytes Absolute: 2.5 10*3/uL (ref 0.7–3.1)
MCH: 31.8 pg (ref 26.6–33.0)
MCHC: 33.3 g/dL (ref 31.5–35.7)
MCV: 96 fL (ref 79–97)
Monocytes Absolute: 0.7 10*3/uL (ref 0.1–0.9)
Monocytes: 9 %
NEUTROS PCT: 51 %
Neutrophils Absolute: 3.6 10*3/uL (ref 1.4–7.0)
PLATELETS: 320 10*3/uL (ref 150–379)
RBC: 4.25 x10E6/uL (ref 3.77–5.28)
RDW: 12.6 % (ref 12.3–15.4)
WBC: 7.1 10*3/uL (ref 3.4–10.8)

## 2015-05-15 LAB — TSH: TSH: 1.45 u[IU]/mL (ref 0.450–4.500)

## 2015-05-15 LAB — VITAMIN D 25 HYDROXY (VIT D DEFICIENCY, FRACTURES): VIT D 25 HYDROXY: 34.3 ng/mL (ref 30.0–100.0)

## 2015-05-18 DIAGNOSIS — Z0000 Encounter for general adult medical examination without abnormal findings: Secondary | ICD-10-CM | POA: Insufficient documentation

## 2015-05-18 NOTE — Assessment & Plan Note (Signed)
Very modestly overweight; goal BMI is less than 25

## 2015-05-18 NOTE — Assessment & Plan Note (Signed)
Check lipids, limit saturated fats; continue statin

## 2015-05-18 NOTE — Assessment & Plan Note (Signed)
Mammograms every 1-2 years, SBE monthly, CBE done

## 2015-05-18 NOTE — Assessment & Plan Note (Signed)
Continue med; controlled today

## 2015-05-18 NOTE — Assessment & Plan Note (Signed)
USPSTF grade A and B recommendations reviewed with patient; age-appropriate recommendations, preventive care, screening tests, etc discussed and encouraged; healthy living encouraged; see AVS for patient education given to patient  

## 2015-05-25 ENCOUNTER — Encounter: Payer: Self-pay | Admitting: Family Medicine

## 2015-05-31 ENCOUNTER — Ambulatory Visit (INDEPENDENT_AMBULATORY_CARE_PROVIDER_SITE_OTHER): Payer: BLUE CROSS/BLUE SHIELD | Admitting: Podiatry

## 2015-05-31 ENCOUNTER — Encounter: Payer: Self-pay | Admitting: Podiatry

## 2015-05-31 VITALS — BP 130/87 | HR 82 | Resp 12

## 2015-05-31 DIAGNOSIS — L905 Scar conditions and fibrosis of skin: Secondary | ICD-10-CM

## 2015-05-31 DIAGNOSIS — M722 Plantar fascial fibromatosis: Secondary | ICD-10-CM | POA: Diagnosis not present

## 2015-05-31 DIAGNOSIS — R52 Pain, unspecified: Principal | ICD-10-CM

## 2015-05-31 NOTE — Progress Notes (Signed)
She presents today for follow-up of an endoscopic plantar fasciotomy left foot. She states it is still very sore and she is 6 weeks out now and would like for this to start getting better.  Objective: Vital signs are stable she is alert and oriented 3. Pulses are palpable. She has a palpable nodule which is more than likely scar tissue to the medial incision site and is painful on ambulation. Her plantar fascia has been released along the medial band which is no longer prominent on palpation with dorsiflexion of the hallux.  Assessment: Painful scar status post endoscopic fasciotomy left foot.  Plan: Injected the scar today with Kenalog and local anesthetic this should start to feel much better. I will follow-up with her in for 6 weeks.

## 2015-06-08 ENCOUNTER — Other Ambulatory Visit: Payer: Self-pay | Admitting: Family Medicine

## 2015-06-08 NOTE — Telephone Encounter (Signed)
Routing to provider  

## 2015-06-08 NOTE — Telephone Encounter (Signed)
CMP done Sept 30, 2016 reviewed; Rx approved

## 2015-06-14 ENCOUNTER — Encounter: Payer: BLUE CROSS/BLUE SHIELD | Admitting: Podiatry

## 2015-06-21 ENCOUNTER — Encounter: Payer: Self-pay | Admitting: Podiatry

## 2015-06-21 ENCOUNTER — Ambulatory Visit (INDEPENDENT_AMBULATORY_CARE_PROVIDER_SITE_OTHER): Payer: BLUE CROSS/BLUE SHIELD | Admitting: Podiatry

## 2015-06-21 VITALS — BP 120/81 | HR 92 | Resp 16

## 2015-06-21 DIAGNOSIS — Z9889 Other specified postprocedural states: Secondary | ICD-10-CM

## 2015-06-21 DIAGNOSIS — M722 Plantar fascial fibromatosis: Secondary | ICD-10-CM

## 2015-06-21 DIAGNOSIS — L905 Scar conditions and fibrosis of skin: Secondary | ICD-10-CM

## 2015-06-21 DIAGNOSIS — R52 Pain, unspecified: Secondary | ICD-10-CM

## 2015-06-21 NOTE — Progress Notes (Signed)
She presents today for follow-up of her plantar fasciitis of her left heel. Last time she was in we injected this arch in the medial aspect of the foot which she states is improved she has some tenderness just distal to the scar at this point states that her heel is doing much better.  Objective: Vital signs are stable she is alert and oriented 3. Pulses are strongly palpable. She has mild tenderness on palpation just distal to the incision site. Plantar heel is doing much better.  Assessment: Painful scar left foot.  Plan: Injected distal to the incision site today. This should alleviate her symptoms. We injected with Kenalog and local anesthetic. I encouraged her to continue massage therapy to the foot. Follow up with her in 1 month if necessary.

## 2015-07-14 ENCOUNTER — Ambulatory Visit (INDEPENDENT_AMBULATORY_CARE_PROVIDER_SITE_OTHER): Payer: BLUE CROSS/BLUE SHIELD

## 2015-07-14 ENCOUNTER — Ambulatory Visit (INDEPENDENT_AMBULATORY_CARE_PROVIDER_SITE_OTHER): Payer: BLUE CROSS/BLUE SHIELD | Admitting: Podiatry

## 2015-07-14 ENCOUNTER — Encounter: Payer: Self-pay | Admitting: Podiatry

## 2015-07-14 DIAGNOSIS — M722 Plantar fascial fibromatosis: Secondary | ICD-10-CM

## 2015-07-14 DIAGNOSIS — M7742 Metatarsalgia, left foot: Secondary | ICD-10-CM | POA: Diagnosis not present

## 2015-07-14 DIAGNOSIS — M84375A Stress fracture, left foot, initial encounter for fracture: Secondary | ICD-10-CM | POA: Diagnosis not present

## 2015-07-14 NOTE — Progress Notes (Signed)
She presents today for a follow-up of her plantar fasciitis left foot. She states that it is doing much better after the last injection. She is status post endoscopic plantar fasciotomy left heel who had considerable scar tissue buildup. She says recently she was shopping in [place] and experienced excruciating pain the dorsal aspect of her foot radiating up her legs and now her toe. She denies trauma. She states that her toe has started to sit up.  Objective: Vital signs are stable alert and oriented 3. Pulses are strongly palpable. Neurologic sensorium is intact. She has swelling across the dorsal aspect of the foot and the second digit is elevated. She has pain on palpation of distal aspect of the second metatarsal overlying the neck. The toe will sit rectus. Radiographs taken today in office 3 views demonstrates a fractured metatarsal neck with lateral displacement it does not appear to be dorsally tilted and appears to be relatively normal with exception of the lateral displacement. This does not appear to be enough to cause dorsiflexion of the second toe and less the flexor tendon was transected which he does not appear to be.  Assessment: Fractured second metatarsal neck left.  Plan: Placed her in a Cam Walker and I will follow-up with her in 1 month for another set of x-rays. I also placed her in a digital splints to hold the toe down at night and also showed her how to tape the toe so they will stay down during the daytime.   Roselind Messier DPM

## 2015-08-23 ENCOUNTER — Ambulatory Visit (INDEPENDENT_AMBULATORY_CARE_PROVIDER_SITE_OTHER): Payer: Medicare HMO | Admitting: Podiatry

## 2015-08-23 ENCOUNTER — Ambulatory Visit: Payer: BLUE CROSS/BLUE SHIELD | Admitting: Podiatry

## 2015-08-23 ENCOUNTER — Ambulatory Visit (INDEPENDENT_AMBULATORY_CARE_PROVIDER_SITE_OTHER): Payer: Medicare HMO

## 2015-08-23 ENCOUNTER — Encounter: Payer: Self-pay | Admitting: Podiatry

## 2015-08-23 VITALS — BP 98/66 | HR 76 | Resp 12

## 2015-08-23 DIAGNOSIS — M84375D Stress fracture, left foot, subsequent encounter for fracture with routine healing: Secondary | ICD-10-CM | POA: Diagnosis not present

## 2015-08-23 NOTE — Progress Notes (Signed)
She presents today for follow-up of her fracture second metatarsal left foot. She states that is still painful and she still has swelling across the dorsal aspect of the foot. States that while she was in [place] she wore the boot the entire time. She denies any further trauma. She continues to wear her digital splint at nighttime to hold the toe down. She dresses the toe down during the day with [employer].  Objective: Vital signs are stable she is alert and oriented 3. With the foot in a rectus position mild hallux valgus deformity is noted with a mild elevated second metatarsal at the fracture site resulting in elevation of the second digit as well. No pain on palpation medial calcaneal tubercle of the left heel. Radiographs today do demonstrate well healing minimally displaced second metatarsal neck fracture.  Assessment well healing fracture second left well resolved plantar fasciitis left.  Plan: Follow up with [place] in 1 month for another set of x-rays. We did discuss the possible need for surgical intervention regarding the bunion the second met and the fifth met.

## 2015-08-31 ENCOUNTER — Other Ambulatory Visit: Payer: Self-pay

## 2015-08-31 MED ORDER — LOVASTATIN 20 MG PO TABS: 20.0000 mg | ORAL_TABLET | Freq: Every day | ORAL | Status: DC

## 2015-08-31 MED ORDER — LISINOPRIL-HYDROCHLOROTHIAZIDE 20-25 MG PO TABS: 1.0000 | ORAL_TABLET | Freq: Every day | ORAL | Status: DC

## 2015-08-31 NOTE — Telephone Encounter (Signed)
Sept 2016 labs reviewed; Rxs approved

## 2015-08-31 NOTE — Telephone Encounter (Signed)
Patient needs 90 day supply. She would like the rx's to be printed and she will pick it up.

## 2015-09-20 ENCOUNTER — Ambulatory Visit (INDEPENDENT_AMBULATORY_CARE_PROVIDER_SITE_OTHER): Payer: Medicare HMO

## 2015-09-20 ENCOUNTER — Ambulatory Visit (INDEPENDENT_AMBULATORY_CARE_PROVIDER_SITE_OTHER): Payer: Medicare HMO | Admitting: Podiatry

## 2015-09-20 ENCOUNTER — Encounter: Payer: Self-pay | Admitting: Podiatry

## 2015-09-20 VITALS — BP 125/84 | HR 86 | Resp 12

## 2015-09-20 DIAGNOSIS — M84375D Stress fracture, left foot, subsequent encounter for fracture with routine healing: Secondary | ICD-10-CM

## 2015-09-20 DIAGNOSIS — M722 Plantar fascial fibromatosis: Secondary | ICD-10-CM | POA: Diagnosis not present

## 2015-09-20 DIAGNOSIS — M84376D Stress fracture, unspecified foot, subsequent encounter for fracture with routine healing: Secondary | ICD-10-CM | POA: Diagnosis not present

## 2015-09-20 DIAGNOSIS — M7752 Other enthesopathy of left foot: Secondary | ICD-10-CM

## 2015-09-20 DIAGNOSIS — M779 Enthesopathy, unspecified: Secondary | ICD-10-CM

## 2015-09-20 DIAGNOSIS — M778 Other enthesopathies, not elsewhere classified: Secondary | ICD-10-CM

## 2015-09-20 NOTE — Progress Notes (Signed)
She presents today for follow-up of fracture second metatarsal left foot. Endoscopic plantar fasciotomy September of last year. She states that the heel still hurts and she's hurting on the top of her foot but proximal to the fracture site. As she points to Lisfranc's joints.  Objective: Vital signs are stable alert and oriented 3. Pulses are palpable left foot. She has no pain on palpation second metatarsal left foot. However she does have pain on frontal plane range of motion of the midfoot as well as along palpation of the plantar fascial calcaneal insertion site.  Assessment: Well healing fracture second metatarsal. Osteoarthritic changes with capsulitis midfoot left. Plantar fasciitis residual left.  Plan: Injected the left heel today as well as the dorsal aspect of the foot hopefully this will break her pain cycle and alleviate some of her symptoms. We discussed appropriate shoe gear stretching exercises and ice therapy. I will follow-up with her in the near future remember to ask if she has a new grandson yet

## 2015-09-24 ENCOUNTER — Ambulatory Visit (INDEPENDENT_AMBULATORY_CARE_PROVIDER_SITE_OTHER): Payer: Medicare HMO | Admitting: Family Medicine

## 2015-09-24 ENCOUNTER — Telehealth: Payer: Self-pay

## 2015-09-24 ENCOUNTER — Encounter: Payer: Self-pay | Admitting: Family Medicine

## 2015-09-24 VITALS — BP 135/89 | HR 70 | Temp 99.1°F | Ht 62.0 in | Wt 135.0 lb

## 2015-09-24 DIAGNOSIS — M25512 Pain in left shoulder: Secondary | ICD-10-CM

## 2015-09-24 DIAGNOSIS — Z23 Encounter for immunization: Secondary | ICD-10-CM | POA: Diagnosis not present

## 2015-09-24 DIAGNOSIS — Z0000 Encounter for general adult medical examination without abnormal findings: Secondary | ICD-10-CM | POA: Diagnosis not present

## 2015-09-24 DIAGNOSIS — M79602 Pain in left arm: Secondary | ICD-10-CM

## 2015-09-24 NOTE — Progress Notes (Signed)
Patient: Carmen Clarke, Female    DOB: February 22, 1951, 65 y.o.   MRN: [id number]  Visit Date: 09/24/2015  Today's Provider: Enid Derry, MD   Chief Complaint  Patient presents with  . Annual Exam    Welcome to Medicare Exam. She wants to check and see if her insurance covers the pneumonia vaccine.    Subjective:   Carmen Clarke is a 65 y.o. female who presents today for her Subsequent Annual Wellness Visit.  Caregiver input:  n/a  USPSTF grade A and B recommendations Alcohol: no Depression: Depression screen [facility] 2/9 09/24/2015 05/14/2015  Decreased Interest 0 0  Down, Depressed, Hopeless 0 0  PHQ - 2 Score 0 0    Hypertension: controlled Obesity: n/a Tobacco use: short time in high school, remote HIV, hep B, hep C: politely declined STD testing and prevention (chl/gon/syphilis): politely declined Lipids: Sept 2016, next April 1st Glucose: normal Sept; next Sept 2019 Colorectal cancer: 2014; no fam hx of colon cancer Breast cancer: UTD BRCA gene screening: n/a Intimate partner violence: n/a Cervical cancer screening: due this fall Lung cancer: n/a Osteoporosis: October 2016, osteopenia, reviewed last scan together Fall prevention/vitamin D: discussed, taking vit D AAA: n/a Aspirin: not taking yet, but will Diet: discussed, limiting egg yolks, eating salads, lots of veggies, better than typical American diet Exercise: stays active, had all sorts of foot problems; went to gym yesterday to use bike, biked 5 miles yesterday Skin cancer: had surgery on right hand for skin cancer; will see Dr. Evorn Gong  HPI  Review of Systems  Past Medical History  Diagnosis Date  . Hypertension   . Hyperlipidemia   . Osteopenia   . Vitamin D deficiency disease   . Menopausal symptoms     previous HRT   Past Surgical History  Procedure Laterality Date  . Femur fracture surgery    . Hip fracture surgery    . Knee arthoscopy Left     partial medial and lateral meniscetomy with  removal of deep hardware from left distal femur  . Foot surgery  Aug. 2016  skin cancer surgery right hand, 2015  Family History  Problem Relation Age of Onset  . Diabetes Mother   . Hypertension Mother   . Hypertension Father   . Cancer Neg Hx   . COPD Neg Hx   . Heart disease Neg Hx   . Stroke Neg Hx     Social History   Social History  . Marital Status: Married    Spouse Name: N/A  . Number of Children: N/A  . Years of Education: N/A   Occupational History  . Not on file.   Social History Main Topics  . Smoking status: Never Smoker   . Smokeless tobacco: Never Used  . Alcohol Use: No  . Drug Use: No  . Sexual Activity: Not on file   Other Topics Concern  . Not on file   Social History Narrative    Outpatient Encounter Prescriptions as of 09/24/2015  Medication Sig Note  . Calcium Carbonate-Vit D-Min (CALCIUM 1200) 1200-1000 MG-UNIT CHEW Chew 1 capsule by mouth daily.   Marland Kitchen lisinopril-hydrochlorothiazide (PRINZIDE,ZESTORETIC) 20-25 MG tablet Take 1 tablet by mouth daily.   Marland Kitchen lovastatin (MEVACOR) 20 MG tablet Take 1 tablet (20 mg total) by mouth at bedtime.   . [DISCONTINUED] cyclobenzaprine (FLEXERIL) 10 MG tablet Reported on 09/24/2015 08/31/2014: Received from: External Pharmacy  . [DISCONTINUED] diclofenac (VOLTAREN) 75 MG EC tablet Take 1 tablet (75 mg total) by  mouth 2 (two) times daily. (Patient not taking: Reported on 09/24/2015)   . [DISCONTINUED] lisinopril-hydrochlorothiazide (PRINZIDE,ZESTORETIC) 20-25 MG tablet TAKE ONE TABLET BY MOUTH ONCE DAILY   . [DISCONTINUED] lovastatin (MEVACOR) 20 MG tablet Take 20 mg by mouth at bedtime.   . [DISCONTINUED] metroNIDAZOLE (METROGEL) 1 % gel Apply topically daily. Reported on 09/24/2015 08/31/2014: Received from: External Pharmacy   No facility-administered encounter medications on file as of 09/24/2015.     Functional Ability / Safety Screening 1.  Was the timed Get Up and Go test longer than 30 seconds?  no 2.  Does  the patient need help with the phone, transportation, shopping,      preparing meals, housework, laundry, medications, or managing money?  no 3.  Does the patient's home have:  loose throw rugs in the hallway?   no      Grab bars in the bathroom? yes      Handrails on the stairs?   no, not to the attic      Poor lighting?   no 4.  Has the patient noticed any hearing difficulties?   no  Fall Risk Assessment See under rooming  Depression Screen See under rooming Depression screen [facility] 2/9 09/24/2015 05/14/2015  Decreased Interest 0 0  Down, Depressed, Hopeless 0 0  PHQ - 2 Score 0 0   Advanced Directives Does patient have a HCPOA?    yes If yes, name and contact information: brother, Ok Anis, she does not know his cell phone number Does patient have a living will or MOST form?  no  Objective:   Vitals: BP 135/89 mmHg  Pulse 70  Temp(Src) 99.1 F (37.3 C)  Ht '5\' 2"'  (1.575 m)  Wt 135 lb (61.236 kg)  BMI 24.69 kg/m2  SpO2 99% Body mass index is 24.69 kg/(m^2).  Visual Acuity Screening   Right eye Left eye Both eyes  Without correction: 20/30 20/20   With correction:       Physical Exam  Constitutional: She appears well-developed and well-nourished.  HENT:  Mouth/Throat: Mucous membranes are normal.  Eyes: EOM are normal. No scleral icterus.  Cardiovascular: Normal rate and regular rhythm.   Pulmonary/Chest: Effort normal and breath sounds normal.  Psychiatric: She has a normal mood and affect. Her behavior is normal.   Mood/affect:  Euthymic, pleasant Appearance:  Casually dressed, no distress  Cognitive Testing - 6-CIT  Correct? Score   What year is it? yes 0 Yes = 0    No = 4  What month is it? yes 0 Yes = 0    No = 3  Remember:     Pia [place], [place], [place]     What time is it? yes 0 Yes = 0    No = 3  Count backwards from 20 to 1 yes 0 Correct = 0    1 error = 2   More than 1 error = 4  Say the months of the year in reverse. yes 0 Correct = 0     1 error = 2   More than 1 error = 4  What address did I ask you to remember? yes 0 Correct = 0  1 error = 2    2 error = 4    3 error = 6    4 error = 8    All wrong = 10       TOTAL SCORE  0/28   Interpretation:  Normal  Normal (  0-7) Abnormal (8-28)   Assessment & Plan:     Annual Wellness Visit  Reviewed patient's Family Medical History Reviewed and updated list of patient's medical providers Assessment of cognitive impairment was done Assessed patient's functional ability Established a written schedule for health screening [place] Completed and Reviewed  Exercise Activities and Dietary recommendations Goals    None      Immunization History  Administered Date(s) Administered  . Influenza,inj,Quad PF,36+ Mos 05/14/2015  . Tdap 05/14/2015  . Zoster 06/14/2012    Health Maintenance  Topic Date Due  . PNA vac Low Risk Adult (1 of 2 - PCV13) 09/02/2015  . HIV Screening  09/23/2016 (Originally 09/01/1965)  . INFLUENZA VACCINE  03/14/2016  . PAP SMEAR  05/07/2016  . MAMMOGRAM  05/20/2017  . COLONOSCOPY  09/09/2022  . TETANUS/TDAP  05/13/2025  . DEXA SCAN  Completed  . ZOSTAVAX  Completed  . Hepatitis C Screening  Addressed     Discussed health benefits of physical activity, and encouraged her to engage in regular exercise appropriate for her age and condition.    Current outpatient prescriptions:  .  Calcium Carbonate-Vit D-Min (CALCIUM 1200) 1200-1000 MG-UNIT CHEW, Chew 1 capsule by mouth daily., Disp: , Rfl:  .  lisinopril-hydrochlorothiazide (PRINZIDE,ZESTORETIC) 20-25 MG tablet, Take 1 tablet by mouth daily., Disp: 90 tablet, Rfl: 1 .  lovastatin (MEVACOR) 20 MG tablet, Take 1 tablet (20 mg total) by mouth at bedtime., Disp: 90 tablet, Rfl: 1 Medications Discontinued During This Encounter  Medication Reason  . cyclobenzaprine (FLEXERIL) 10 MG tablet Patient has not taken in last 30 days  . diclofenac (VOLTAREN) 75 MG EC tablet Patient has  not taken in last 30 days  . metroNIDAZOLE (METROGEL) 1 % gel Patient has not taken in last 30 days    Next Medicare Wellness Visit in 12+ months  An after-visit summary was printed and given to the patient at [facility].  Please see the patient instructions which may contain other information and recommendations beyond what is mentioned above in the assessment and plan.

## 2015-09-24 NOTE — Patient Instructions (Addendum)
Your next labs will be due on or after April 1st [facility] staff --> please give paperwork for advanced directives and living will and [place] Maintenance  Topic Date Due  . PNA vac Low Risk Adult (1 of 2 - PCV13) 09/02/2015  . HIV Screening  09/23/2016 (Originally 09/01/1965)  . INFLUENZA VACCINE  03/14/2016  . PAP SMEAR  05/07/2016  . MAMMOGRAM  05/20/2017  . COLONOSCOPY  09/09/2022  . TETANUS/TDAP  05/13/2025  . DEXA SCAN  Completed  . ZOSTAVAX  Completed  . Hepatitis C Screening  Addressed  PCV-13 given today Your next pneumonia vaccine will be due in 6-12 months, and that is a PPSV-23 (Pneumovax)  Your next bone density test will be due around October or November of 2018 Your next pap smear will be due this September  Immunization History  Administered Date(s) Administered  . Influenza,inj,Quad PF,36+ Mos 05/14/2015  . Tdap 05/14/2015  . Zoster 06/14/2012

## 2015-09-24 NOTE — Assessment & Plan Note (Signed)
USPSTF grade A and B recommendations reviewed with patient; age-appropriate recommendations, preventive care, screening tests, etc discussed and encouraged; healthy living encouraged; see AVS for patient education given to patient  

## 2015-09-24 NOTE — Telephone Encounter (Signed)
She forgot to ask you for another referral to [facility] for left arm and shoulder pain. She states we referred her last year and it helped a lot.

## 2015-09-28 DIAGNOSIS — M79602 Pain in left arm: Secondary | ICD-10-CM | POA: Insufficient documentation

## 2015-09-28 DIAGNOSIS — M25512 Pain in left shoulder: Secondary | ICD-10-CM | POA: Insufficient documentation

## 2015-09-28 NOTE — Telephone Encounter (Signed)
Okay for referral?

## 2015-10-25 ENCOUNTER — Encounter: Payer: Self-pay | Admitting: Podiatry

## 2015-10-25 ENCOUNTER — Ambulatory Visit (INDEPENDENT_AMBULATORY_CARE_PROVIDER_SITE_OTHER): Payer: Medicare HMO | Admitting: Podiatry

## 2015-10-25 VITALS — BP 120/80 | HR 68 | Resp 12

## 2015-10-25 DIAGNOSIS — G5792 Unspecified mononeuropathy of left lower limb: Secondary | ICD-10-CM

## 2015-10-25 MED ORDER — GABAPENTIN 100 MG PO CAPS: 100.0000 mg | ORAL_CAPSULE | Freq: Every day | ORAL | Status: DC

## 2015-10-25 NOTE — Progress Notes (Signed)
She presents today for a follow-up of pain to the dorsal aspect of her left foot and anterior ankle they came about after a fracture of the second metatarsal of the left foot. She states that really take for the sheets to even touch my toe it hurts as she refers to the now dorsiflexed second digit of the left foot. She states that she has burning and tingling in the foot at nighttime and has also had ankle pain since she had a run through an airport recently.  Objective: Vital signs are stable she is alert and oriented 3. Pulses are palpable. She has no reproducible pain on palpation of the foot. However she does have some tenderness on palpation of the extensor tendons as they cross the ankle.  Assessment: Neuritis associated with previous fracture and extensor tendinitis more than likely associated with her favoring this foot and compensating.  Plan: I will start her on gabapentin 100 mg daily at bedtime and I will follow-up with her in 1 month. I also suggested that she utilize ice for the tendinitis.

## 2015-10-26 ENCOUNTER — Ambulatory Visit: Payer: Medicare HMO | Admitting: Physical Therapy

## 2015-10-28 ENCOUNTER — Ambulatory Visit: Payer: Self-pay | Admitting: Physical Therapy

## 2015-11-09 ENCOUNTER — Telehealth: Payer: Self-pay | Admitting: Family Medicine

## 2015-11-09 DIAGNOSIS — E785 Hyperlipidemia, unspecified: Secondary | ICD-10-CM

## 2015-11-09 DIAGNOSIS — Z5181 Encounter for therapeutic drug level monitoring: Secondary | ICD-10-CM

## 2015-11-09 NOTE — Assessment & Plan Note (Signed)
Check lipid panel fasting later this week; on statin

## 2015-11-09 NOTE — Telephone Encounter (Signed)
Dr. Sanda Klein, will you enter the labs she needs?  Kristin Bruins, will you do the pre auth for her PT?  Thanks!

## 2015-11-09 NOTE — Telephone Encounter (Signed)
Pre authorization telephone # [phone]. Thanks.

## 2015-11-09 NOTE — Telephone Encounter (Signed)
Of course, future orders entered

## 2015-11-09 NOTE — Telephone Encounter (Signed)
Pt called to see when her labs are due. Pt stated labs were supposed to be completed by this Friday. No orders found In EPIC. Pt also needs a call from Amy concerning PT authorization. Thanks.

## 2015-11-15 NOTE — Telephone Encounter (Signed)
Patient had an appointment and confirmed appointment 10/26/2015 and patient no showed for her appointment.  Spoke with Debbie at [facility] in [place].  Confirmed with Debbie patient DOES NOT need a prior authorization for Physical Therapy. Patient has [employer] HMO/PPO.  Contacted patient and she stated she wanted to go to [facility]. She still stated her insurance required a prior authorization and I explained to patient that her insurance may not have covered [facility] because it is not apart of [facility] (in network). If she wanted to a new referral for [facility] patient would have to contact [facility] to get that set up again.   Patient notified and number to [facility] given.

## 2015-12-01 ENCOUNTER — Ambulatory Visit (INDEPENDENT_AMBULATORY_CARE_PROVIDER_SITE_OTHER): Payer: Medicare HMO | Admitting: Podiatry

## 2015-12-01 ENCOUNTER — Encounter: Payer: Self-pay | Admitting: Podiatry

## 2015-12-01 VITALS — BP 114/67 | HR 75 | Resp 16

## 2015-12-01 DIAGNOSIS — G5792 Unspecified mononeuropathy of left lower limb: Secondary | ICD-10-CM

## 2015-12-01 DIAGNOSIS — M84375D Stress fracture, left foot, subsequent encounter for fracture with routine healing: Secondary | ICD-10-CM | POA: Diagnosis not present

## 2015-12-01 DIAGNOSIS — M722 Plantar fascial fibromatosis: Secondary | ICD-10-CM

## 2015-12-01 NOTE — Progress Notes (Signed)
She presents today for follow-up of pain to the left foot. She initially was doing very well after endoscopic plantar fasciotomy and then after a [place] the airport with [place] develop pain along the dorsal aspect of the foot bilateral foot medial foot and heel once again. We have tried gabapentin to no avail. We tried booting her to know about.  Objective: No change in physical findings left foot.  Assessment: Neuritis and tendinitis of the dorsal left foot plantar fasciitis and peroneal tendinitis left foot.  Plan: At this point I'm requesting that she follow-up with physical therapy.

## 2015-12-10 ENCOUNTER — Telehealth: Payer: Self-pay | Admitting: *Deleted

## 2015-12-10 NOTE — Telephone Encounter (Addendum)
Pt called states needs refill Gabapentin 100mg , that Dr. Milinda Pointer increase at last visit. I reviewed pt's 12/01/2015 office visit and medication orders, there was no change in orders.  12/13/2015-Pt states she was taking Gabapentin 100mg  3 times day, but was making groggy, and now her insurance won't cover the Gabapentin 3xd until 12/25/2015, but she has 2 refills of the original rx of 100mg /day.  I told pt to see how much the Gabapentin would cost without insurance coverage and take as Dr. Milinda Pointer had directed 3xd, and to call me on 12/25/2015 and we would order the new Gabapentin rx for 3xd #90 with refills.

## 2015-12-12 ENCOUNTER — Other Ambulatory Visit: Payer: Self-pay | Admitting: Podiatry

## 2015-12-12 NOTE — Telephone Encounter (Signed)
Valery call her and see what she was taking then prescribe it.

## 2016-02-08 DIAGNOSIS — M6281 Muscle weakness (generalized): Secondary | ICD-10-CM | POA: Diagnosis not present

## 2016-02-08 DIAGNOSIS — M25572 Pain in left ankle and joints of left foot: Secondary | ICD-10-CM | POA: Diagnosis not present

## 2016-02-08 DIAGNOSIS — R262 Difficulty in walking, not elsewhere classified: Secondary | ICD-10-CM | POA: Diagnosis not present

## 2016-02-14 ENCOUNTER — Encounter: Payer: Self-pay | Admitting: Family Medicine

## 2016-02-24 ENCOUNTER — Other Ambulatory Visit: Payer: Self-pay | Admitting: Podiatry

## 2016-02-24 NOTE — Telephone Encounter (Signed)
Pt needs to follow up with Dr. Milinda Pointer for a long-term medication exam prior to future refills.

## 2016-02-25 DIAGNOSIS — M25572 Pain in left ankle and joints of left foot: Secondary | ICD-10-CM | POA: Diagnosis not present

## 2016-02-25 DIAGNOSIS — R262 Difficulty in walking, not elsewhere classified: Secondary | ICD-10-CM | POA: Diagnosis not present

## 2016-02-25 DIAGNOSIS — M6281 Muscle weakness (generalized): Secondary | ICD-10-CM | POA: Diagnosis not present

## 2016-03-02 DIAGNOSIS — R262 Difficulty in walking, not elsewhere classified: Secondary | ICD-10-CM | POA: Diagnosis not present

## 2016-03-02 DIAGNOSIS — M6281 Muscle weakness (generalized): Secondary | ICD-10-CM | POA: Diagnosis not present

## 2016-03-02 DIAGNOSIS — M25572 Pain in left ankle and joints of left foot: Secondary | ICD-10-CM | POA: Diagnosis not present

## 2016-03-07 DIAGNOSIS — R262 Difficulty in walking, not elsewhere classified: Secondary | ICD-10-CM | POA: Diagnosis not present

## 2016-03-07 DIAGNOSIS — M25572 Pain in left ankle and joints of left foot: Secondary | ICD-10-CM | POA: Diagnosis not present

## 2016-03-07 DIAGNOSIS — M6281 Muscle weakness (generalized): Secondary | ICD-10-CM | POA: Diagnosis not present

## 2016-03-10 DIAGNOSIS — R262 Difficulty in walking, not elsewhere classified: Secondary | ICD-10-CM | POA: Diagnosis not present

## 2016-03-10 DIAGNOSIS — M6281 Muscle weakness (generalized): Secondary | ICD-10-CM | POA: Diagnosis not present

## 2016-03-10 DIAGNOSIS — M25572 Pain in left ankle and joints of left foot: Secondary | ICD-10-CM | POA: Diagnosis not present

## 2016-03-15 DIAGNOSIS — R262 Difficulty in walking, not elsewhere classified: Secondary | ICD-10-CM | POA: Diagnosis not present

## 2016-03-15 DIAGNOSIS — M6281 Muscle weakness (generalized): Secondary | ICD-10-CM | POA: Diagnosis not present

## 2016-03-15 DIAGNOSIS — M25572 Pain in left ankle and joints of left foot: Secondary | ICD-10-CM | POA: Diagnosis not present

## 2016-03-17 DIAGNOSIS — M25572 Pain in left ankle and joints of left foot: Secondary | ICD-10-CM | POA: Diagnosis not present

## 2016-03-17 DIAGNOSIS — R262 Difficulty in walking, not elsewhere classified: Secondary | ICD-10-CM | POA: Diagnosis not present

## 2016-03-20 DIAGNOSIS — R262 Difficulty in walking, not elsewhere classified: Secondary | ICD-10-CM | POA: Diagnosis not present

## 2016-03-20 DIAGNOSIS — M6281 Muscle weakness (generalized): Secondary | ICD-10-CM | POA: Diagnosis not present

## 2016-03-20 DIAGNOSIS — M25572 Pain in left ankle and joints of left foot: Secondary | ICD-10-CM | POA: Diagnosis not present

## 2016-03-27 DIAGNOSIS — M25572 Pain in left ankle and joints of left foot: Secondary | ICD-10-CM | POA: Diagnosis not present

## 2016-03-27 DIAGNOSIS — M6281 Muscle weakness (generalized): Secondary | ICD-10-CM | POA: Diagnosis not present

## 2016-03-27 DIAGNOSIS — R262 Difficulty in walking, not elsewhere classified: Secondary | ICD-10-CM | POA: Diagnosis not present

## 2016-03-29 DIAGNOSIS — R262 Difficulty in walking, not elsewhere classified: Secondary | ICD-10-CM | POA: Diagnosis not present

## 2016-03-29 DIAGNOSIS — M25572 Pain in left ankle and joints of left foot: Secondary | ICD-10-CM | POA: Diagnosis not present

## 2016-03-29 DIAGNOSIS — M6281 Muscle weakness (generalized): Secondary | ICD-10-CM | POA: Diagnosis not present

## 2016-03-31 DIAGNOSIS — L570 Actinic keratosis: Secondary | ICD-10-CM | POA: Diagnosis not present

## 2016-03-31 DIAGNOSIS — Z85828 Personal history of other malignant neoplasm of skin: Secondary | ICD-10-CM | POA: Diagnosis not present

## 2016-03-31 DIAGNOSIS — L309 Dermatitis, unspecified: Secondary | ICD-10-CM | POA: Diagnosis not present

## 2016-03-31 DIAGNOSIS — X32XXXA Exposure to sunlight, initial encounter: Secondary | ICD-10-CM | POA: Diagnosis not present

## 2016-03-31 DIAGNOSIS — L728 Other follicular cysts of the skin and subcutaneous tissue: Secondary | ICD-10-CM | POA: Diagnosis not present

## 2016-03-31 DIAGNOSIS — Z08 Encounter for follow-up examination after completed treatment for malignant neoplasm: Secondary | ICD-10-CM | POA: Diagnosis not present

## 2016-04-03 DIAGNOSIS — M25572 Pain in left ankle and joints of left foot: Secondary | ICD-10-CM | POA: Diagnosis not present

## 2016-04-03 DIAGNOSIS — R262 Difficulty in walking, not elsewhere classified: Secondary | ICD-10-CM | POA: Diagnosis not present

## 2016-04-03 DIAGNOSIS — M6281 Muscle weakness (generalized): Secondary | ICD-10-CM | POA: Diagnosis not present

## 2016-04-05 ENCOUNTER — Other Ambulatory Visit: Payer: Self-pay

## 2016-04-06 MED ORDER — LOVASTATIN 20 MG PO TABS: 20.0000 mg | ORAL_TABLET | Freq: Every day | ORAL | 0 refills | Status: DC

## 2016-04-06 NOTE — Telephone Encounter (Signed)
I'll refill the medicine, but please remind patient that she was due to have her cholesterol and other labs checked April 1st; the orders are still in there and she can go to [facility] (or you can re-enter them if she wants to go to [facility]) Thank you

## 2016-04-07 NOTE — Telephone Encounter (Signed)
Left voice mail

## 2016-04-10 ENCOUNTER — Other Ambulatory Visit: Payer: Self-pay

## 2016-04-10 ENCOUNTER — Telehealth: Payer: Self-pay | Admitting: Family Medicine

## 2016-04-10 MED ORDER — LOVASTATIN 20 MG PO TABS: 20.0000 mg | ORAL_TABLET | Freq: Every day | ORAL | 0 refills | Status: DC

## 2016-04-10 NOTE — Telephone Encounter (Signed)
Please resend to corrected pharmacy

## 2016-04-14 ENCOUNTER — Other Ambulatory Visit: Payer: Self-pay | Admitting: Family Medicine

## 2016-04-14 DIAGNOSIS — E785 Hyperlipidemia, unspecified: Secondary | ICD-10-CM | POA: Diagnosis not present

## 2016-04-14 DIAGNOSIS — Z5181 Encounter for therapeutic drug level monitoring: Secondary | ICD-10-CM | POA: Diagnosis not present

## 2016-04-14 LAB — COMPLETE METABOLIC PANEL WITH GFR
ALT: 13 U/L (ref 6–29)
AST: 18 U/L (ref 10–35)
Albumin: 4.7 g/dL (ref 3.6–5.1)
Alkaline Phosphatase: 79 U/L (ref 33–130)
BUN: 22 mg/dL (ref 7–25)
CALCIUM: 9.7 mg/dL (ref 8.6–10.4)
CHLORIDE: 103 mmol/L (ref 98–110)
CO2: 31 mmol/L (ref 20–31)
CREATININE: 0.86 mg/dL (ref 0.50–0.99)
GFR, EST AFRICAN AMERICAN: 82 mL/min (ref >=60)
GFR, EST NON AFRICAN AMERICAN: 71 mL/min (ref >=60)
Glucose, Bld: 96 mg/dL (ref 65–99)
POTASSIUM: 4.5 mmol/L (ref 3.5–5.3)
Sodium: 142 mmol/L (ref 135–146)
Total Bilirubin: 0.5 mg/dL (ref 0.2–1.2)
Total Protein: 6.8 g/dL (ref 6.1–8.1)

## 2016-04-14 NOTE — Assessment & Plan Note (Signed)
Check labs 

## 2016-04-15 LAB — LIPID PANEL W/DIRECT LDL/HDL RATIO
CHOL/HDL RATIO: 1.7 ratio (ref <=5.0)
CHOLESTEROL - DIR LDL/HDL RATIO: 176 mg/dL (ref 125–200)
HDL: 103 mg/dL (ref >=46)
LDL DIRECT: 74 mg/dL (ref <130)
LDL:HDL Ratio: 0.7 Ratio
TRIGLYCERIDES: 87 mg/dL (ref <150)

## 2016-04-19 NOTE — Telephone Encounter (Signed)
done

## 2016-06-02 ENCOUNTER — Encounter: Payer: Medicare HMO | Admitting: Family Medicine

## 2016-06-21 DIAGNOSIS — R69 Illness, unspecified: Secondary | ICD-10-CM | POA: Diagnosis not present

## 2016-06-30 ENCOUNTER — Ambulatory Visit (INDEPENDENT_AMBULATORY_CARE_PROVIDER_SITE_OTHER): Payer: Medicare HMO | Admitting: Family Medicine

## 2016-06-30 ENCOUNTER — Encounter: Payer: Self-pay | Admitting: Family Medicine

## 2016-06-30 VITALS — BP 118/70 | HR 97 | Temp 99.1°F | Resp 14 | Ht 61.5 in | Wt 139.3 lb

## 2016-06-30 DIAGNOSIS — Z1239 Encounter for other screening for malignant neoplasm of breast: Secondary | ICD-10-CM

## 2016-06-30 DIAGNOSIS — Z23 Encounter for immunization: Secondary | ICD-10-CM | POA: Diagnosis not present

## 2016-06-30 DIAGNOSIS — Z1231 Encounter for screening mammogram for malignant neoplasm of breast: Secondary | ICD-10-CM | POA: Diagnosis not present

## 2016-06-30 DIAGNOSIS — Z0000 Encounter for general adult medical examination without abnormal findings: Secondary | ICD-10-CM

## 2016-06-30 DIAGNOSIS — I10 Essential (primary) hypertension: Secondary | ICD-10-CM | POA: Diagnosis not present

## 2016-06-30 DIAGNOSIS — Z5181 Encounter for therapeutic drug level monitoring: Secondary | ICD-10-CM | POA: Diagnosis not present

## 2016-06-30 DIAGNOSIS — E782 Mixed hyperlipidemia: Secondary | ICD-10-CM | POA: Diagnosis not present

## 2016-06-30 MED ORDER — LISINOPRIL-HYDROCHLOROTHIAZIDE 20-25 MG PO TABS: 1.0000 | ORAL_TABLET | Freq: Every day | ORAL | 1 refills | Status: DC

## 2016-06-30 MED ORDER — LOVASTATIN 20 MG PO TABS: 20.0000 mg | ORAL_TABLET | Freq: Every day | ORAL | 1 refills | Status: DC

## 2016-06-30 NOTE — Assessment & Plan Note (Signed)
Check lipids; limit saturated fats 

## 2016-06-30 NOTE — Assessment & Plan Note (Signed)
Check labs 

## 2016-06-30 NOTE — Assessment & Plan Note (Signed)
USPSTF grade A and B recommendations reviewed with patient; age-appropriate recommendations, preventive care, screening tests, etc discussed and encouraged; healthy living encouraged; see AVS for patient education given to patient  

## 2016-06-30 NOTE — Patient Instructions (Addendum)
Take the 2,000 iu of vitamin D3 about four days a week Start 81 mg coated baby aspirin daily for stroke prevention We'll get labs today Please do call to schedule your mammogram; the number to schedule one at either [facility] or [facility] is [phone] Return for a Medicare Wellness visit when due Return in 12+ months for your next physical exam if insurance covers this for you Health Maintenance, Female Introduction Adopting a healthy lifestyle and getting preventive care can go a long way to promote health and wellness. Talk with your health care provider about what schedule of regular examinations is right for you. This is a good chance for you to check in with your provider about disease prevention and staying healthy. In between checkups, there are plenty of things you can do on your own. Experts have done a lot of research about which lifestyle changes and preventive measures are most likely to keep you healthy. Ask your health care provider for more information. Weight and diet Eat a healthy diet  Be sure to include plenty of vegetables, fruits, low-fat dairy products, and lean protein.  Do not eat a lot of foods high in solid fats, added sugars, or salt.  Get regular exercise. This is one of the most important things you can do for your health.  Most adults should exercise for at least 150 minutes each week. The exercise should increase your heart rate and make you sweat (moderate-intensity exercise).  Most adults should also do strengthening exercises at least twice a week. This is in addition to the moderate-intensity exercise. Maintain a healthy weight  Body mass index (BMI) is a measurement that can be used to identify possible weight problems. It estimates body fat based on height and weight. Your health care provider can help determine your BMI and help you achieve or maintain a healthy weight.  For females 65 years of age and older:  A BMI  below 18.5 is considered underweight.  A BMI of 18.5 to 24.9 is normal.  A BMI of 25 to 29.9 is considered overweight.  A BMI of 30 and above is considered obese. Watch levels of cholesterol and blood lipids  You should start having your blood tested for lipids and cholesterol at 65 years of age, then have this test every 5 years.  You may need to have your cholesterol levels checked more often if:  Your lipid or cholesterol levels are high.  You are older than 65 years of age.  You are at high risk for heart disease. Cancer screening Lung Cancer  Lung cancer screening is recommended for adults 22-32 years old who are at high risk for lung cancer because of a history of smoking.  A yearly low-dose CT scan of the lungs is recommended for people who:  Currently smoke.  Have quit within the past 15 years.  Have at least a 30-pack-year history of smoking. A pack year is smoking an average of one pack of cigarettes a day for 1 year.  Yearly screening should continue until it has been 15 years since you quit.  Yearly screening should stop if you develop a health problem that would prevent you from having lung cancer treatment. Breast Cancer  Practice breast self-awareness. This means understanding how your breasts normally appear and feel.  It also means doing regular breast self-exams. Let your health care provider know about any changes, no matter how small.  If you are in your 20s or 30s,  you should have a clinical breast exam (CBE) by a health care provider every 1-3 years as part of a regular health exam.  If you are 40 or older, have a CBE every year. Also consider having a breast X-ray (mammogram) every year.  If you have a family history of breast cancer, talk to your health care provider about genetic screening.  If you are at high risk for breast cancer, talk to your health care provider about having an MRI and a mammogram every year.  Breast cancer gene (BRCA)  assessment is recommended for women who have family members with BRCA-related cancers. BRCA-related cancers include:  Breast.  Ovarian.  Tubal.  Peritoneal cancers.  Results of the assessment will determine the need for genetic counseling and BRCA1 and BRCA2 testing. Cervical Cancer  Your health care provider may recommend that you be screened regularly for cancer of the pelvic organs (ovaries, uterus, and vagina). This screening involves a pelvic examination, including checking for microscopic changes to the surface of your cervix (Pap test). You may be encouraged to have this screening done every 3 years, beginning at age 21.  For women ages 30-65, health care providers may recommend pelvic exams and Pap testing every 3 years, or they may recommend the Pap and pelvic exam, combined with testing for human papilloma virus (HPV), every 5 years. Some types of HPV increase your risk of cervical cancer. Testing for HPV may also be done on women of any age with unclear Pap test results.  Other health care providers may not recommend any screening for nonpregnant women who are considered low risk for pelvic cancer and who do not have symptoms. Ask your health care provider if a screening pelvic exam is right for you.  If you have had past treatment for cervical cancer or a condition that could lead to cancer, you need Pap tests and screening for cancer for at least 20 years after your treatment. If Pap tests have been discontinued, your risk factors (such as having a new sexual partner) need to be reassessed to determine if screening should resume. Some women have medical problems that increase the chance of getting cervical cancer. In these cases, your health care provider may recommend more frequent screening and Pap tests. Colorectal Cancer  This type of cancer can be detected and often prevented.  Routine colorectal cancer screening usually begins at 65 years of age and continues through 65  years of age.  Your health care provider may recommend screening at an earlier age if you have risk factors for colon cancer.  Your health care provider may also recommend using home test kits to check for hidden blood in the stool.  A small camera at the end of a tube can be used to examine your colon directly (sigmoidoscopy or colonoscopy). This is done to check for the earliest forms of colorectal cancer.  Routine screening usually begins at age 50.  Direct examination of the colon should be repeated every 5-10 years through 65 years of age. However, you may need to be screened more often if early forms of precancerous polyps or small growths are found. Skin Cancer  Check your skin from head to toe regularly.  Tell your health care provider about any new moles or changes in moles, especially if there is a change in a mole's shape or color.  Also tell your health care provider if you have a mole that is larger than the size of a pencil eraser.  Always   use sunscreen. Apply sunscreen liberally and repeatedly throughout the day.  Protect yourself by wearing long sleeves, pants, a wide-brimmed hat, and sunglasses whenever you are outside. Heart disease, diabetes, and high blood pressure  High blood pressure causes heart disease and increases the risk of stroke. High blood pressure is more likely to develop in:  People who have blood pressure in the high end of the normal range (130-139/85-89 mm Hg).  People who are overweight or obese.  People who are African American.  If you are 18-39 years of age, have your blood pressure checked every 3-5 years. If you are 40 years of age or older, have your blood pressure checked every year. You should have your blood pressure measured twice-once when you are at a hospital or clinic, and once when you are not at a hospital or clinic. Record the average of the two measurements. To check your blood pressure when you are not at a hospital or clinic,  you can use:  An automated blood pressure machine at a pharmacy.  A home blood pressure monitor.  If you are between 55 years and 79 years old, ask your health care provider if you should take aspirin to prevent strokes.  Have regular diabetes screenings. This involves taking a blood sample to check your fasting blood sugar level.  If you are at a normal weight and have a low risk for diabetes, have this test once every three years after 65 years of age.  If you are overweight and have a high risk for diabetes, consider being tested at a younger age or more often. Preventing infection Hepatitis B  If you have a higher risk for hepatitis B, you should be screened for this virus. You are considered at high risk for hepatitis B if:  You were born in a country where hepatitis B is common. Ask your health care provider which countries are considered high risk.  Your parents were born in a high-risk country, and you have not been immunized against hepatitis B (hepatitis B vaccine).  You have HIV or AIDS.  You use needles to inject street drugs.  You live with someone who has hepatitis B.  You have had sex with someone who has hepatitis B.  You get hemodialysis treatment.  You take certain medicines for conditions, including cancer, organ transplantation, and autoimmune conditions. Hepatitis C  Blood testing is recommended for:  Everyone born from 1945 through 1965.  Anyone with known risk factors for hepatitis C. Sexually transmitted infections (STIs)  You should be screened for sexually transmitted infections (STIs) including gonorrhea and chlamydia if:  You are sexually active and are younger than 65 years of age.  You are older than 65 years of age and your health care provider tells you that you are at risk for this type of infection.  Your sexual activity has changed since you were last screened and you are at an increased risk for chlamydia or gonorrhea. Ask your  health care provider if you are at risk.  If you do not have HIV, but are at risk, it may be recommended that you take a prescription medicine daily to prevent HIV infection. This is called pre-exposure prophylaxis (PrEP). You are considered at risk if:  You are sexually active and do not regularly use condoms or know the HIV status of your partner(s).  You take drugs by injection.  You are sexually active with a partner who has HIV. Talk with your health care provider about   whether you are at high risk of being infected with HIV. If you choose to begin PrEP, you should first be tested for HIV. You should then be tested every 3 months for as long as you are taking PrEP. Pregnancy  If you are premenopausal and you may become pregnant, ask your health care provider about preconception counseling.  If you may become pregnant, take 400 to 800 micrograms (mcg) of folic acid every day.  If you want to prevent pregnancy, talk to your health care provider about birth control (contraception). Osteoporosis and menopause  Osteoporosis is a disease in which the bones lose minerals and strength with aging. This can result in serious bone fractures. Your risk for osteoporosis can be identified using a bone density scan.  If you are 83 years of age or older, or if you are at risk for osteoporosis and fractures, ask your health care provider if you should be screened.  Ask your health care provider whether you should take a calcium or vitamin D supplement to lower your risk for osteoporosis.  Menopause may have certain physical symptoms and risks.  Hormone replacement therapy may reduce some of these symptoms and risks. Talk to your health care provider about whether hormone replacement therapy is right for you. Follow these instructions at home:  Schedule regular health, dental, and eye exams.  Stay current with your immunizations.  Do not use any tobacco products including cigarettes, chewing  tobacco, or electronic cigarettes.  If you are pregnant, do not drink alcohol.  If you are breastfeeding, limit how much and how often you drink alcohol.  Limit alcohol intake to no more than 1 drink per day for nonpregnant women. One drink equals 12 ounces of beer, 5 ounces of wine, or 1 ounces of hard liquor.  Do not use street drugs.  Do not share needles.  Ask your health care provider for help if you need support or information about quitting drugs.  Tell your health care provider if you often feel depressed.  Tell your health care provider if you have ever been abused or do not feel safe at home. This information is not intended to replace advice given to you by your health care provider. Make sure you discuss any questions you have with your health care provider. Document Released: 02/13/2011 Document Revised: 01/06/2016 Document Reviewed: 05/04/2015  2017 Elsevier

## 2016-06-30 NOTE — Assessment & Plan Note (Signed)
Controlled; continue meds 

## 2016-06-30 NOTE — Progress Notes (Signed)
Patient ID: Carmen Clarke, female   DOB: 04/23/1951, 65 y.o.   MRN: [id number]   Subjective:   Carmen Clarke is a 65 y.o. female here for a complete physical exam  Interim issues since last visit: heel surgery Aug 2016  Hypernsion  Needs refills of her lipid medicine; mother had a ministroke earlier this year and she's more cognizant of keeping up with this  USPSTF grade A and B recommendations Alcohol: no Depression: Depression screen [facility] 2/9 06/30/2016 09/24/2015 05/14/2015  Decreased Interest 0 0 0  Down, Depressed, Hopeless 0 0 0  PHQ - 2 Score 0 0 0    Hypertension: controlled Obesity: controlled Tobacco use: remote teenager smoking HIV, hep B, hep C: declined HIV, offered Hep C STD testing and prevention (chl/gon/syphilis): declined Lipids: excellent HDL and LDL Glucose: normal in Sept Colorectal cancer: 2014; no fam hx of colon cancer Breast cancer: no fam hx; no lumps BRCA gene screening: no breast cancer in fam; no ovarian cancer Intimate partner violence: no Cervical cancer screening: UTD; no abnormals Lung cancer: n/a Osteoporosis: last Oct 2016; next due Oct 2018 Fall prevention/vitamin D: taking 2000 iu daily AAA: no fam hx Aspirin: not taking 81 mg Diet: trying to do a good job Exercise: was going to [employer] but got plantar fasciitis, had to stop the treadmill; doing weights at home Skin cancer: few things taken off by derm  Past Medical History:  Diagnosis Date  . Hyperlipidemia   . Hypertension   . Menopausal symptoms    previous HRT  . Osteopenia   . Vitamin D deficiency disease    Past Surgical History:  Procedure Laterality Date  . FEMUR FRACTURE SURGERY    . FOOT SURGERY  Aug. 2016  . HIP FRACTURE SURGERY    . knee arthoscopy Left    partial medial and lateral meniscetomy with removal of deep hardware from left distal femur  she had foot surgery, but got stress fracture and never got over it; never went back; Dr. Milinda Pointer; still  having pain, it's still bothering her; tried conservative measures for 8 months; surgery, then out of boot after 2 weeks; scar tissue medially LEFT heel; extreme pain from scar tissue; was getting cortisone shots and overall thinks it ruined her foot  Family History  Problem Relation Age of Onset  . Diabetes Mother   . Hypertension Mother   . Hypertension Father   . Cancer Neg Hx   . COPD Neg Hx   . Heart disease Neg Hx   . Stroke Neg Hx    Social History  Substance Use Topics  . Smoking status: Never Smoker  . Smokeless tobacco: Never Used  . Alcohol use No   Review of Systems  Objective:   Vitals:   06/30/16 1058  BP: 118/70  Pulse: 97  Resp: 14  Temp: 99.1 F (37.3 C)  TempSrc: Oral  SpO2: 97%  Weight: 139 lb 5 oz (63.2 kg)  Height: 5' 1.5" (1.562 m)   Body mass index is 25.9 kg/m. Wt Readings from Last 3 Encounters:  06/30/16 139 lb 5 oz (63.2 kg)  09/24/15 135 lb (61.2 kg)  05/14/15 140 lb (63.5 kg)   Physical Exam  Constitutional: She appears well-developed and well-nourished.  HENT:  Head: Normocephalic and atraumatic.  Right Ear: Hearing, tympanic membrane, external ear and ear canal normal.  Left Ear: Hearing, tympanic membrane, external ear and ear canal normal.  Eyes: Conjunctivae and EOM are normal. Right eye  exhibits no hordeolum. Left eye exhibits no hordeolum. No scleral icterus.  Neck: Carotid bruit is not present. No thyromegaly present.  Cardiovascular: Normal rate, regular rhythm, S1 normal, S2 normal and normal heart sounds.   No extrasystoles are present.  Pulmonary/Chest: Effort normal and breath sounds normal. No respiratory distress. Right breast exhibits no inverted nipple, no mass, no nipple discharge, no skin change and no tenderness. Left breast exhibits no inverted nipple, no mass, no nipple discharge, no skin change and no tenderness. Breasts are symmetrical.  Abdominal: Soft. Normal appearance and bowel sounds are normal. She exhibits  no distension, no abdominal bruit, no pulsatile midline mass and no mass. There is no hepatosplenomegaly. There is no tenderness. No hernia.  Musculoskeletal: Normal range of motion. She exhibits no edema.  Lymphadenopathy:       Head (right side): No submandibular adenopathy present.       Head (left side): No submandibular adenopathy present.    She has no cervical adenopathy.    She has no axillary adenopathy.  Neurological: She is alert. She displays no tremor. No cranial nerve deficit. She exhibits normal muscle tone. Gait normal.  Reflex Scores:      Patellar reflexes are 2+ on the right side and 2+ on the left side. Skin: Skin is warm and dry. No bruising and no ecchymosis noted. No cyanosis. No pallor.  Psychiatric: Her speech is normal and behavior is normal. Thought content normal. Her mood appears not anxious. She does not exhibit a depressed mood.    Assessment/Plan:   Problem List Items Addressed This Visit      Cardiovascular and Mediastinum   Hypertension    Controlled; continue meds      Relevant Medications   lovastatin (MEVACOR) 20 MG tablet   lisinopril-hydrochlorothiazide (PRINZIDE,ZESTORETIC) 20-25 MG tablet     Other   Preventative health care - Primary    USPSTF grade A and B recommendations reviewed with patient; age-appropriate recommendations, preventive care, screening tests, etc discussed and encouraged; healthy living encouraged; see AVS for patient education given to patient      Relevant Orders   MM DIGITAL SCREENING BILATERAL   Medication monitoring encounter    Check labs      Relevant Orders   COMPLETE METABOLIC PANEL WITH GFR   Hyperlipidemia    Check lipids; limit saturated fats      Relevant Medications   lovastatin (MEVACOR) 20 MG tablet   lisinopril-hydrochlorothiazide (PRINZIDE,ZESTORETIC) 20-25 MG tablet   Other Relevant Orders   Lipid panel   Breast cancer screening    Mammograms every year       Other Visit Diagnoses     Needs flu shot       Relevant Orders   Flu vaccine HIGH DOSE PF (Fluzone High dose) (Completed)       Meds ordered this encounter  Medications  . lovastatin (MEVACOR) 20 MG tablet    Sig: Take 1 tablet (20 mg total) by mouth at bedtime.    Dispense:  90 tablet    Refill:  1  . lisinopril-hydrochlorothiazide (PRINZIDE,ZESTORETIC) 20-25 MG tablet    Sig: Take 1 tablet by mouth daily.    Dispense:  90 tablet    Refill:  1   Orders Placed This Encounter  Procedures  . MM DIGITAL SCREENING BILATERAL    Order Specific Question:   Reason for Exam (SYMPTOM  OR DIAGNOSIS REQUIRED)    Answer:   screening    Order Specific Question:  Preferred imaging location?    Answer:   [facility]  . Flu vaccine HIGH DOSE PF (Fluzone High dose)  . Lipid panel  . COMPLETE METABOLIC PANEL WITH GFR    Follow up plan: Return in about 1 year (around 06/30/2017) for complete physical with pap smear; Medicare Wellness visit when due.  An After Visit Summary was printed and given to the patient.

## 2016-06-30 NOTE — Assessment & Plan Note (Signed)
Mammograms every year

## 2016-07-14 DIAGNOSIS — Z1231 Encounter for screening mammogram for malignant neoplasm of breast: Secondary | ICD-10-CM | POA: Diagnosis not present

## 2016-08-25 ENCOUNTER — Other Ambulatory Visit: Payer: Medicare HMO

## 2016-08-25 ENCOUNTER — Other Ambulatory Visit: Payer: Self-pay | Admitting: Family Medicine

## 2016-08-25 DIAGNOSIS — H01132 Eczematous dermatitis of right lower eyelid: Secondary | ICD-10-CM | POA: Diagnosis not present

## 2016-08-25 DIAGNOSIS — H01135 Eczematous dermatitis of left lower eyelid: Secondary | ICD-10-CM | POA: Diagnosis not present

## 2016-08-25 DIAGNOSIS — Z5181 Encounter for therapeutic drug level monitoring: Secondary | ICD-10-CM | POA: Diagnosis not present

## 2016-08-25 DIAGNOSIS — E782 Mixed hyperlipidemia: Secondary | ICD-10-CM | POA: Diagnosis not present

## 2016-08-25 LAB — COMPLETE METABOLIC PANEL WITH GFR
ALK PHOS: 86 U/L (ref 33–130)
ALT: 14 U/L (ref 6–29)
AST: 19 U/L (ref 10–35)
Albumin: 4.6 g/dL (ref 3.6–5.1)
BUN: 17 mg/dL (ref 7–25)
CHLORIDE: 101 mmol/L (ref 98–110)
CO2: 27 mmol/L (ref 20–31)
Calcium: 9.7 mg/dL (ref 8.6–10.4)
Creat: 0.85 mg/dL (ref 0.50–0.99)
GFR, EST NON AFRICAN AMERICAN: 72 mL/min (ref >=60)
GFR, Est African American: 83 mL/min (ref >=60)
GLUCOSE: 83 mg/dL (ref 65–99)
POTASSIUM: 4.5 mmol/L (ref 3.5–5.3)
SODIUM: 139 mmol/L (ref 135–146)
Total Bilirubin: 0.5 mg/dL (ref 0.2–1.2)
Total Protein: 6.9 g/dL (ref 6.1–8.1)

## 2016-08-25 LAB — LIPID PANEL
CHOL/HDL RATIO: 2.2 ratio (ref <5.0)
Cholesterol: 191 mg/dL (ref <200)
HDL: 87 mg/dL (ref >50)
LDL CALC: 82 mg/dL (ref <100)
Triglycerides: 111 mg/dL (ref <150)
VLDL: 22 mg/dL (ref <30)

## 2016-12-01 DIAGNOSIS — Z85828 Personal history of other malignant neoplasm of skin: Secondary | ICD-10-CM | POA: Diagnosis not present

## 2016-12-01 DIAGNOSIS — H01131 Eczematous dermatitis of right upper eyelid: Secondary | ICD-10-CM | POA: Diagnosis not present

## 2016-12-01 DIAGNOSIS — L820 Inflamed seborrheic keratosis: Secondary | ICD-10-CM | POA: Diagnosis not present

## 2016-12-01 DIAGNOSIS — H01134 Eczematous dermatitis of left upper eyelid: Secondary | ICD-10-CM | POA: Diagnosis not present

## 2016-12-01 DIAGNOSIS — L570 Actinic keratosis: Secondary | ICD-10-CM | POA: Diagnosis not present

## 2017-02-06 ENCOUNTER — Other Ambulatory Visit: Payer: Self-pay | Admitting: Family Medicine

## 2017-02-06 NOTE — Telephone Encounter (Signed)
Pt has a month left on lisinopri, therefore she do not need a refill .l. The lovastatin she has only 4 days left. Pt was last seen in November. In Dr. lada notes she asked her to come back in a year but prescribed these meds for only 6 months. Last labs in november 08/25/2016

## 2017-03-09 DIAGNOSIS — S82025A Nondisplaced longitudinal fracture of left patella, initial encounter for closed fracture: Secondary | ICD-10-CM | POA: Diagnosis not present

## 2017-03-09 DIAGNOSIS — S82002A Unspecified fracture of left patella, initial encounter for closed fracture: Secondary | ICD-10-CM | POA: Diagnosis not present

## 2017-03-12 DIAGNOSIS — S82025A Nondisplaced longitudinal fracture of left patella, initial encounter for closed fracture: Secondary | ICD-10-CM | POA: Diagnosis not present

## 2017-03-14 DIAGNOSIS — S82025A Nondisplaced longitudinal fracture of left patella, initial encounter for closed fracture: Secondary | ICD-10-CM | POA: Diagnosis not present

## 2017-03-28 ENCOUNTER — Encounter: Payer: Self-pay | Admitting: Family Medicine

## 2017-03-28 DIAGNOSIS — S82002A Unspecified fracture of left patella, initial encounter for closed fracture: Secondary | ICD-10-CM | POA: Insufficient documentation

## 2017-03-28 HISTORY — DX: Unspecified fracture of left patella, initial encounter for closed fracture: S82.002A

## 2017-04-13 ENCOUNTER — Telehealth: Payer: Self-pay | Admitting: Family Medicine

## 2017-04-13 DIAGNOSIS — S82025A Nondisplaced longitudinal fracture of left patella, initial encounter for closed fracture: Secondary | ICD-10-CM | POA: Diagnosis not present

## 2017-04-13 DIAGNOSIS — M8580 Other specified disorders of bone density and structure, unspecified site: Secondary | ICD-10-CM

## 2017-04-13 NOTE — Telephone Encounter (Signed)
I received a note from [place] asking about a DEXA scan She had her last DEXA in October 2016 She'll be due for next DEXA in October 2018 I'll enter order Please let her know she can call to schedule that (last done at [facility]) Thank you

## 2017-04-18 ENCOUNTER — Telehealth: Payer: Self-pay | Admitting: Family Medicine

## 2017-04-18 DIAGNOSIS — M25562 Pain in left knee: Secondary | ICD-10-CM | POA: Diagnosis not present

## 2017-04-18 DIAGNOSIS — R609 Edema, unspecified: Secondary | ICD-10-CM | POA: Diagnosis not present

## 2017-04-18 DIAGNOSIS — M25662 Stiffness of left knee, not elsewhere classified: Secondary | ICD-10-CM | POA: Diagnosis not present

## 2017-04-18 NOTE — Telephone Encounter (Signed)
Carmen Clarke from [facility] would like to know when pt had last dexa scan due to paient having fracture knee earlier this summer. Would like to discuss further and will also fax over some information [phone]

## 2017-04-18 NOTE — Telephone Encounter (Signed)
Called Carmen Clarke back from [place] notified her last dexa was 05/21/15 and we have ordered another.

## 2017-04-18 NOTE — Telephone Encounter (Signed)
Pt has been notified.

## 2017-04-23 DIAGNOSIS — R609 Edema, unspecified: Secondary | ICD-10-CM | POA: Diagnosis not present

## 2017-04-23 DIAGNOSIS — M25562 Pain in left knee: Secondary | ICD-10-CM | POA: Diagnosis not present

## 2017-04-23 DIAGNOSIS — M25662 Stiffness of left knee, not elsewhere classified: Secondary | ICD-10-CM | POA: Diagnosis not present

## 2017-04-25 DIAGNOSIS — M25562 Pain in left knee: Secondary | ICD-10-CM | POA: Diagnosis not present

## 2017-04-25 DIAGNOSIS — M25662 Stiffness of left knee, not elsewhere classified: Secondary | ICD-10-CM | POA: Diagnosis not present

## 2017-04-25 DIAGNOSIS — R609 Edema, unspecified: Secondary | ICD-10-CM | POA: Diagnosis not present

## 2017-04-30 DIAGNOSIS — M25662 Stiffness of left knee, not elsewhere classified: Secondary | ICD-10-CM | POA: Diagnosis not present

## 2017-04-30 DIAGNOSIS — M25562 Pain in left knee: Secondary | ICD-10-CM | POA: Diagnosis not present

## 2017-04-30 DIAGNOSIS — R609 Edema, unspecified: Secondary | ICD-10-CM | POA: Diagnosis not present

## 2017-05-02 DIAGNOSIS — M25562 Pain in left knee: Secondary | ICD-10-CM | POA: Diagnosis not present

## 2017-05-02 DIAGNOSIS — R609 Edema, unspecified: Secondary | ICD-10-CM | POA: Diagnosis not present

## 2017-05-02 DIAGNOSIS — M25662 Stiffness of left knee, not elsewhere classified: Secondary | ICD-10-CM | POA: Diagnosis not present

## 2017-05-16 DIAGNOSIS — M25562 Pain in left knee: Secondary | ICD-10-CM | POA: Diagnosis not present

## 2017-05-16 DIAGNOSIS — M25662 Stiffness of left knee, not elsewhere classified: Secondary | ICD-10-CM | POA: Diagnosis not present

## 2017-05-16 DIAGNOSIS — R609 Edema, unspecified: Secondary | ICD-10-CM | POA: Diagnosis not present

## 2017-05-17 DIAGNOSIS — M7062 Trochanteric bursitis, left hip: Secondary | ICD-10-CM | POA: Diagnosis not present

## 2017-05-17 DIAGNOSIS — M25562 Pain in left knee: Secondary | ICD-10-CM | POA: Diagnosis not present

## 2017-05-17 DIAGNOSIS — S82015D Nondisplaced osteochondral fracture of left patella, subsequent encounter for closed fracture with routine healing: Secondary | ICD-10-CM | POA: Diagnosis not present

## 2017-05-28 DIAGNOSIS — M25662 Stiffness of left knee, not elsewhere classified: Secondary | ICD-10-CM | POA: Diagnosis not present

## 2017-05-28 DIAGNOSIS — R609 Edema, unspecified: Secondary | ICD-10-CM | POA: Diagnosis not present

## 2017-05-28 DIAGNOSIS — M25562 Pain in left knee: Secondary | ICD-10-CM | POA: Diagnosis not present

## 2017-06-01 ENCOUNTER — Ambulatory Visit (INDEPENDENT_AMBULATORY_CARE_PROVIDER_SITE_OTHER): Payer: Medicare HMO

## 2017-06-01 DIAGNOSIS — Z23 Encounter for immunization: Secondary | ICD-10-CM

## 2017-06-05 DIAGNOSIS — R69 Illness, unspecified: Secondary | ICD-10-CM | POA: Diagnosis not present

## 2017-06-08 DIAGNOSIS — Z780 Asymptomatic menopausal state: Secondary | ICD-10-CM | POA: Diagnosis not present

## 2017-06-08 DIAGNOSIS — M8589 Other specified disorders of bone density and structure, multiple sites: Secondary | ICD-10-CM | POA: Diagnosis not present

## 2017-06-08 LAB — HM DEXA SCAN

## 2017-06-26 DIAGNOSIS — S82002D Unspecified fracture of left patella, subsequent encounter for closed fracture with routine healing: Secondary | ICD-10-CM | POA: Diagnosis not present

## 2017-06-26 DIAGNOSIS — M7072 Other bursitis of hip, left hip: Secondary | ICD-10-CM | POA: Diagnosis not present

## 2017-07-04 ENCOUNTER — Telehealth: Payer: Self-pay | Admitting: Family Medicine

## 2017-07-04 DIAGNOSIS — M8588 Other specified disorders of bone density and structure, other site: Secondary | ICD-10-CM

## 2017-07-04 NOTE — Telephone Encounter (Signed)
Please let the patient know the following:  Your bone density shows that you have osteopenia, which means that your bone is thinner than it should be but not as far along as osteoporosis.  Your risk of a fracture is slightly elevated, but not to the point of requiring medication at this point. We should get another bone scan in two years to monitor. Please do practice fall precautions (don't get up on chairs or ladders to reach high things, don't go out on slippery steps in the winter, always use handrails when going up or down stairs, etc.). Try to get three servings of calcium a day (best in foods/drinks like kale, spinach, broccoli, almond milk, tofu, etc.).

## 2017-07-04 NOTE — Telephone Encounter (Signed)
Left detailed voicemail

## 2017-07-13 ENCOUNTER — Ambulatory Visit (INDEPENDENT_AMBULATORY_CARE_PROVIDER_SITE_OTHER): Payer: Medicare HMO | Admitting: Family Medicine

## 2017-07-13 ENCOUNTER — Encounter: Payer: Self-pay | Admitting: Family Medicine

## 2017-07-13 VITALS — BP 112/68 | HR 71 | Temp 98.2°F | Resp 14 | Ht 61.63 in | Wt 138.6 lb

## 2017-07-13 DIAGNOSIS — Z0000 Encounter for general adult medical examination without abnormal findings: Secondary | ICD-10-CM

## 2017-07-13 DIAGNOSIS — I10 Essential (primary) hypertension: Secondary | ICD-10-CM | POA: Diagnosis not present

## 2017-07-13 DIAGNOSIS — Z5181 Encounter for therapeutic drug level monitoring: Secondary | ICD-10-CM

## 2017-07-13 DIAGNOSIS — E559 Vitamin D deficiency, unspecified: Secondary | ICD-10-CM | POA: Diagnosis not present

## 2017-07-13 DIAGNOSIS — Z1231 Encounter for screening mammogram for malignant neoplasm of breast: Secondary | ICD-10-CM | POA: Diagnosis not present

## 2017-07-13 DIAGNOSIS — Z124 Encounter for screening for malignant neoplasm of cervix: Secondary | ICD-10-CM | POA: Diagnosis not present

## 2017-07-13 DIAGNOSIS — S82002D Unspecified fracture of left patella, subsequent encounter for closed fracture with routine healing: Secondary | ICD-10-CM | POA: Diagnosis not present

## 2017-07-13 DIAGNOSIS — Z23 Encounter for immunization: Secondary | ICD-10-CM | POA: Insufficient documentation

## 2017-07-13 DIAGNOSIS — E782 Mixed hyperlipidemia: Secondary | ICD-10-CM | POA: Diagnosis not present

## 2017-07-13 DIAGNOSIS — Z1239 Encounter for other screening for malignant neoplasm of breast: Secondary | ICD-10-CM

## 2017-07-13 DIAGNOSIS — M8588 Other specified disorders of bone density and structure, other site: Secondary | ICD-10-CM

## 2017-07-13 MED ORDER — LOVASTATIN 20 MG PO TABS: 20.0000 mg | ORAL_TABLET | Freq: Every day | ORAL | 1 refills | Status: DC

## 2017-07-13 MED ORDER — LISINOPRIL-HYDROCHLOROTHIAZIDE 20-25 MG PO TABS: 1.0000 | ORAL_TABLET | Freq: Every day | ORAL | 1 refills | Status: DC

## 2017-07-13 NOTE — Assessment & Plan Note (Signed)
Check sgpt and Cr and K+ 

## 2017-07-13 NOTE — Assessment & Plan Note (Signed)
Offered PPSV-23 today and no further boosters per ACIP guidelines

## 2017-07-13 NOTE — Assessment & Plan Note (Signed)
Mammograms yearly; SBE monthly; CBE done today

## 2017-07-13 NOTE — Assessment & Plan Note (Signed)
Doing well; continue current medicine regimen; refills provided; monitor Cr and electrolytes periodically; plan to check again in January

## 2017-07-13 NOTE — Assessment & Plan Note (Signed)
Pap smear today. 

## 2017-07-13 NOTE — Progress Notes (Signed)
Patient: Carmen Clarke, Female    DOB: [birth year], 66 y.o.   MRN: [id number]  Visit Date: 07/13/2017  Today's Provider: Enid Derry, MD   Chief Complaint  Patient presents with  . Medicare Wellness    Subjective:   Carmen Clarke is a 66 y.o. female who presents today for her Subsequent Annual Wellness Visit.  Since last visit, she fell in July, had just been to [place]; stepped around the car, little piece of wood, flicked it out of the way, then she sailed for six feet into golf cart, fell on the concrete and fractured the left kneecap; landed on hard mat; they put her in an immobilizer for 3 days, then saw surgeon, Dr. Harlow Mares and he put her in straight-leg brace for 8 weeks, 6 turned into 8 weeks; never got a blood clot; no driving for 8 weeks; can bend it now, no limitations  Hypertension; on lisinopril/hctz combination; no excessive dry mouth; no excessive urination; avoids drinking late in the evening; she does add salt when cooking; never adds at the table; no decongestants  High cholesterol; on a statin; rarely eats eggs; not as a breakfast eggs, just deviled eggs at Thanksgiving; loves hot dogs but has cut down on those; return for fasting labs in January  --------------------------------------------  USPSTF grade A and B recommendations Depression:  Depression screen [facility] 2/9 07/13/2017 06/30/2016 09/24/2015 05/14/2015  Decreased Interest 0 0 0 0  Down, Depressed, Hopeless 0 0 0 0  PHQ - 2 Score 0 0 0 0   Hypertension: BP Readings from Last 3 Encounters:  07/13/17 112/68  06/30/16 118/70  12/01/15 114/67   Obesity: lost weight since last visit Wt Readings from Last 3 Encounters:  07/13/17 138 lb 9.6 oz (62.9 kg)  06/30/16 139 lb 5 oz (63.2 kg)  09/24/15 135 lb (61.2 kg)   BMI Readings from Last 3 Encounters:  07/13/17 25.66 kg/m  06/30/16 25.90 kg/m  09/24/15 24.69 kg/m    Skin cancer: no worrisome moles; seeing dermatologist Lung cancer:  No smoking  hx Breast cancer: no lumps or bumps; mammo next Friday Colorectal cancer: 2014; can't remember if 10 year pass or 5 year pass  BRCA gene screening: family hx of breast and/or ovarian cancer and/or metastatic prostate cancer? no Cervical cancer screening: today; no hx of abnormal HIV, hep B, hep C: hep C screen done and negative, May 2016 STD testing and prevention (chl/gon/syphilis): interested Intimate partner violence: no abuse Contraception: n/a Osteoporosis: taking vitamin D, DEXA done October 2018; reviewed findings; also taking calcium  Fall prevention/vitamin D: discussed  Diet: enough fruits and veggies; does not like oatmeal; brown bread; does not like it but eats it; changes rice to brown; not much red meat, hamburger yesterday but that's seldom Exercise: usually 150 minutes+ per week Alcohol: no  Tobacco use: remote really nothing Aspirin: ran out of baby aspirin Lipids:  Lab Results  Component Value Date   CHOL 191 08/25/2016   CHOL 178 05/14/2015   Lab Results  Component Value Date   HDL 87 08/25/2016   HDL 103 04/14/2016   HDL 90 05/14/2015   Lab Results  Component Value Date   LDLCALC 82 08/25/2016   LDLCALC 65 05/14/2015   Lab Results  Component Value Date   TRIG 111 08/25/2016   TRIG 87 04/14/2016   TRIG 115 05/14/2015   Lab Results  Component Value Date   CHOLHDL 2.2 08/25/2016   CHOLHDL 1.7 04/14/2016   Lab Results  Component Value Date   LDLDIRECT 74 04/14/2016   Glucose:  Glucose  Date Value Ref Range Status  05/14/2015 95 65 - 99 mg/dL Final   Glucose, Bld  Date Value Ref Range Status  08/25/2016 83 65 - 99 mg/dL Final  04/14/2016 96 65 - 99 mg/dL Final   AAA: no fam hx  Review of Systems  Constitutional: Negative for unexpected weight change.  HENT: Negative for hearing loss and nosebleeds.   Eyes: Negative for visual disturbance.  Respiratory: Negative for wheezing.   Cardiovascular: Negative for chest pain.   Gastrointestinal: Negative for blood in stool.  Endocrine: Negative for polydipsia.  Genitourinary: Negative for hematuria.  Musculoskeletal:       Knee fracture  Skin:       No worrisome moles  Allergic/Immunologic: Negative for food allergies.  Neurological: Negative for tremors.  Psychiatric/Behavioral: Negative for dysphoric mood.   Past Medical History:  Diagnosis Date  . Fracture of patella, left, closed 03/28/2017   2018  . Hyperlipidemia   . Hypertension   . Menopausal symptoms    previous HRT  . Osteopenia   . Vitamin D deficiency disease     Past Surgical History:  Procedure Laterality Date  . FEMUR FRACTURE SURGERY    . FOOT SURGERY  Aug. 2016  . HIP FRACTURE SURGERY    . knee arthoscopy Left    partial medial and lateral meniscetomy with removal of deep hardware from left distal femur    Family History  Problem Relation Age of Onset  . Diabetes Mother   . Hypertension Mother   . Hypertension Father   . Cancer Neg Hx   . COPD Neg Hx   . Heart disease Neg Hx   . Stroke Neg Hx     Social History   Tobacco Use  . Smoking status: Never Smoker  . Smokeless tobacco: Never Used  Substance Use Topics  . Alcohol use: No    Alcohol/week: 0.0 oz  . Drug use: No    Outpatient Encounter Medications as of 07/13/2017  Medication Sig  . aspirin EC 81 MG tablet Take 81 mg by mouth daily.  . Calcium Carbonate-Vit D-Min (CALCIUM 1200) 1200-1000 MG-UNIT CHEW Chew 1 capsule by mouth daily.  Marland Kitchen lisinopril-hydrochlorothiazide (PRINZIDE,ZESTORETIC) 20-25 MG tablet Take 1 tablet by mouth daily.  Marland Kitchen lovastatin (MEVACOR) 20 MG tablet Take 1 tablet (20 mg total) by mouth at bedtime.  . [DISCONTINUED] lisinopril-hydrochlorothiazide (PRINZIDE,ZESTORETIC) 20-25 MG tablet TAKE ONE TABLET BY MOUTH ONCE DAILY  . [DISCONTINUED] lovastatin (MEVACOR) 20 MG tablet TAKE ONE TABLET BY MOUTH AT BEDTIME  . [DISCONTINUED] gabapentin (NEURONTIN) 100 MG capsule TAKE 1 CAPSULE (100 MG  TOTAL) BY MOUTH AT BEDTIME. (Patient not taking: Reported on 06/30/2016)   No facility-administered encounter medications on file as of 07/13/2017.     Functional Ability / Safety Screening 1.  Was the timed Get Up and Go test less than 12 s2econds?  yes 2.  Does the patient need help with the phone, transportation, shopping,      preparing meals, housework, laundry, medications, or managing money?  no 3.  Does the patient's home have:  loose throw rugs in the hallway?   no      Grab bars in the bathroom? yes      Handrails on the stairs?   only one step to get into house      Good lighting?   yes 4.  Has the patient noticed any hearing difficulties?  no  Advanced Directives Does patient have a HCPOA?    yes If yes, name and contact information: husband, Masae, Lukacs [phone] cell [phone] home Does patient have a living will or MOST form?  yes  If she could be resuscitated, give it a try Sounds like she wants to be full code at her current level of activity and health  Immunizations: shingles at the local pharmacy; PPSV-23 today  Fall Risk Assessment See under rooming  Depression Screen See under rooming Depression screen [facility] 2/9 07/13/2017 06/30/2016 09/24/2015 05/14/2015  Decreased Interest 0 0 0 0  Down, Depressed, Hopeless 0 0 0 0  PHQ - 2 Score 0 0 0 0    Objective:   Vitals: BP 112/68   Pulse 71   Temp 98.2 F (36.8 C) (Oral)   Resp 14   Ht 5' 1.63" (1.565 m)   Wt 138 lb 9.6 oz (62.9 kg)   SpO2 96%   BMI 25.66 kg/m  Body mass index is 25.66 kg/m. No exam data present  Physical Exam  Constitutional: She appears well-developed and well-nourished.  HENT:  Head: Normocephalic and atraumatic.  Eyes: Conjunctivae and EOM are normal. Right eye exhibits no hordeolum. Left eye exhibits no hordeolum. No scleral icterus.  Neck: Carotid bruit is not present. No thyromegaly present.  Cardiovascular: Normal rate, regular rhythm, S1 normal, S2 normal  and normal heart sounds.  No extrasystoles are present.  Pulmonary/Chest: Effort normal and breath sounds normal. No respiratory distress. Right breast exhibits no inverted nipple, no mass, no nipple discharge, no skin change and no tenderness. Left breast exhibits no inverted nipple, no mass, no nipple discharge, no skin change and no tenderness. Breasts are symmetrical.  Abdominal: Soft. Normal appearance and bowel sounds are normal. She exhibits no distension, no abdominal bruit, no pulsatile midline mass and no mass. There is no hepatosplenomegaly. There is no tenderness. No hernia.  Genitourinary: Uterus normal. Pelvic exam was performed with patient prone. There is no rash or lesion on the right labia. There is no rash or lesion on the left labia. Cervix exhibits no motion tenderness. Right adnexum displays no mass, no tenderness and no fullness. Left adnexum displays no mass, no tenderness and no fullness.  Musculoskeletal: Normal range of motion. She exhibits no edema.  Lymphadenopathy:       Head (right side): No submandibular adenopathy present.       Head (left side): No submandibular adenopathy present.    She has no cervical adenopathy.    She has no axillary adenopathy.  Neurological: She is alert. She displays no tremor. No cranial nerve deficit. She exhibits normal muscle tone. Gait normal.  Skin: Skin is warm and dry. No bruising and no ecchymosis noted. No cyanosis. No pallor.  Psychiatric: Her speech is normal and behavior is normal. Thought content normal. Her mood appears not anxious. She does not exhibit a depressed mood.   Mood/affect:  euthymic Appearance:  Casually dressed, good hygiene  No flowsheet data found.  Assessment & Plan:     Annual Wellness Visit  Reviewed patient's Family Medical History Reviewed and updated list of patient's medical providers Assessment of cognitive impairment was done Assessed patient's functional ability Established a written  schedule for health screening [place] Completed and Reviewed  Exercise Activities and Dietary recommendations Goals    . Prevent falls       Immunization History  Administered Date(s) Administered  . Influenza, High Dose Seasonal PF 06/30/2016, 06/01/2017  .  Influenza,inj,Quad PF,6+ Mos 05/14/2015  . Pneumococcal Conjugate-13 09/24/2015  . Pneumococcal Polysaccharide-23 07/13/2017  . Tdap 05/14/2015  . Zoster 06/14/2012    Health Maintenance  Topic Date Due  . MAMMOGRAM  07/14/2017  . DEXA SCAN  06/08/2018  . COLONOSCOPY  09/09/2022  . TETANUS/TDAP  05/13/2025  . INFLUENZA VACCINE  Completed  . PNA vac Low Risk Adult  Completed  . Hepatitis C Screening  Addressed    Discussed health benefits of physical activity, and encouraged her to engage in regular exercise appropriate for her age and condition.   Meds ordered this encounter  Medications  . lovastatin (MEVACOR) 20 MG tablet    Sig: Take 1 tablet (20 mg total) by mouth at bedtime.    Dispense:  90 tablet    Refill:  1  . lisinopril-hydrochlorothiazide (PRINZIDE,ZESTORETIC) 20-25 MG tablet    Sig: Take 1 tablet by mouth daily.    Dispense:  90 tablet    Refill:  1    Current Outpatient Medications:  .  aspirin EC 81 MG tablet, Take 81 mg by mouth daily., Disp: , Rfl:  .  Calcium Carbonate-Vit D-Min (CALCIUM 1200) 1200-1000 MG-UNIT CHEW, Chew 1 capsule by mouth daily., Disp: , Rfl:  .  lisinopril-hydrochlorothiazide (PRINZIDE,ZESTORETIC) 20-25 MG tablet, Take 1 tablet by mouth daily., Disp: 90 tablet, Rfl: 1 .  lovastatin (MEVACOR) 20 MG tablet, Take 1 tablet (20 mg total) by mouth at bedtime., Disp: 90 tablet, Rfl: 1 Medications Discontinued During This Encounter  Medication Reason  . gabapentin (NEURONTIN) 100 MG capsule   . lovastatin (MEVACOR) 20 MG tablet Reorder  . lisinopril-hydrochlorothiazide (PRINZIDE,ZESTORETIC) 20-25 MG tablet Reorder   Next Medicare Wellness Visit in 12+  months  Problem List Items Addressed This Visit      Cardiovascular and Mediastinum   Hypertension    Doing well; continue current medicine regimen; refills provided; monitor Cr and electrolytes periodically; plan to check again in January      Relevant Medications   aspirin EC 81 MG tablet   lovastatin (MEVACOR) 20 MG tablet   lisinopril-hydrochlorothiazide (PRINZIDE,ZESTORETIC) 20-25 MG tablet     Musculoskeletal and Integument   Osteopenia    Next DEXA due June 09, 2019; discussed calcium intake, vit D, fall precautions      Fracture of patella, left, closed    Improving; not limiting gait; does not appear to increase fall risk        Other   Vitamin D deficiency disease    Last vit D level was normal; she is on supplement; will skip this expensive test      Preventative health care    Wellness visit and physical done today Primary reason for today's visit was preventive care; age-appropriate health maintenance discussed, recommended tests/screenings ordered (unless declined by patient) and healthy living encouraged       Pneumococcal vaccination administered at current visit    Offered PPSV-23 today and no further boosters per ACIP guidelines      Medication monitoring encounter    Check sgpt and Cr and K+      Relevant Orders   COMPLETE METABOLIC PANEL WITH GFR   Hyperlipidemia    Continue current medicine; limit saturated fats, encourged whole grains; refill of statin provided; check lipids in January      Relevant Medications   aspirin EC 81 MG tablet   lovastatin (MEVACOR) 20 MG tablet   lisinopril-hydrochlorothiazide (PRINZIDE,ZESTORETIC) 20-25 MG tablet   Other Relevant Orders   Lipid  panel   Cervical cancer screening    Pap smear today      Relevant Orders   Pap IG and HPV (high risk) DNA detection   Breast cancer screening    Mammograms yearly; SBE monthly; CBE done today

## 2017-07-13 NOTE — Assessment & Plan Note (Signed)
Last vit D level was normal; she is on supplement; will skip this expensive test

## 2017-07-13 NOTE — Patient Instructions (Addendum)
Health Maintenance, Female Adopting a healthy lifestyle and getting preventive care can go a long way to promote health and wellness. Talk with your health care provider about what schedule of regular examinations is right for you. This is a good chance for you to check in with your provider about disease prevention and staying healthy. In between checkups, there are plenty of things you can do on your own. Experts have done a lot of research about which lifestyle changes and preventive measures are most likely to keep you healthy. Ask your health care provider for more information. Weight and diet Eat a healthy diet  Be sure to include plenty of vegetables, fruits, low-fat dairy products, and lean protein.  Do not eat a lot of foods high in solid fats, added sugars, or salt.  Get regular exercise. This is one of the most important things you can do for your health. ? Most adults should exercise for at least 150 minutes each week. The exercise should increase your heart rate and make you sweat (moderate-intensity exercise). ? Most adults should also do strengthening exercises at least twice a week. This is in addition to the moderate-intensity exercise.  Maintain a healthy weight  Body mass index (BMI) is a measurement that can be used to identify possible weight problems. It estimates body fat based on height and weight. Your health care provider can help determine your BMI and help you achieve or maintain a healthy weight.  For females 20 years of age and older: ? A BMI below 18.5 is considered underweight. ? A BMI of 18.5 to 24.9 is normal. ? A BMI of 25 to 29.9 is considered overweight. ? A BMI of 30 and above is considered obese.  Watch levels of cholesterol and blood lipids  You should start having your blood tested for lipids and cholesterol at 66 years of age, then have this test every 5 years.  You may need to have your cholesterol levels checked more often if: ? Your lipid or  cholesterol levels are high. ? You are older than 66 years of age. ? You are at high risk for heart disease.  Cancer screening Lung Cancer  Lung cancer screening is recommended for adults 55-80 years old who are at high risk for lung cancer because of a history of smoking.  A yearly low-dose CT scan of the lungs is recommended for people who: ? Currently smoke. ? Have quit within the past 15 years. ? Have at least a 30-pack-year history of smoking. A pack year is smoking an average of one pack of cigarettes a day for 1 year.  Yearly screening should continue until it has been 15 years since you quit.  Yearly screening should stop if you develop a health problem that would prevent you from having lung cancer treatment.  Breast Cancer  Practice breast self-awareness. This means understanding how your breasts normally appear and feel.  It also means doing regular breast self-exams. Let your health care provider know about any changes, no matter how small.  If you are in your 20s or 30s, you should have a clinical breast exam (CBE) by a health care provider every 1-3 years as part of a regular health exam.  If you are 40 or older, have a CBE every year. Also consider having a breast X-ray (mammogram) every year.  If you have a family history of breast cancer, talk to your health care provider about genetic screening.  If you are at high risk   for breast cancer, talk to your health care provider about having an MRI and a mammogram every year.  Breast cancer gene (BRCA) assessment is recommended for women who have family members with BRCA-related cancers. BRCA-related cancers include: ? Breast. ? Ovarian. ? Tubal. ? Peritoneal cancers.  Results of the assessment will determine the need for genetic counseling and BRCA1 and BRCA2 testing.  Cervical Cancer Your health care provider may recommend that you be screened regularly for cancer of the pelvic organs (ovaries, uterus, and  vagina). This screening involves a pelvic examination, including checking for microscopic changes to the surface of your cervix (Pap test). You may be encouraged to have this screening done every 3 years, beginning at age 22.  For women ages 56-65, health care providers may recommend pelvic exams and Pap testing every 3 years, or they may recommend the Pap and pelvic exam, combined with testing for human papilloma virus (HPV), every 5 years. Some types of HPV increase your risk of cervical cancer. Testing for HPV may also be done on women of any age with unclear Pap test results.  Other health care providers may not recommend any screening for nonpregnant women who are considered low risk for pelvic cancer and who do not have symptoms. Ask your health care provider if a screening pelvic exam is right for you.  If you have had past treatment for cervical cancer or a condition that could lead to cancer, you need Pap tests and screening for cancer for at least 20 years after your treatment. If Pap tests have been discontinued, your risk factors (such as having a new sexual partner) need to be reassessed to determine if screening should resume. Some women have medical problems that increase the chance of getting cervical cancer. In these cases, your health care provider may recommend more frequent screening and Pap tests.  Colorectal Cancer  This type of cancer can be detected and often prevented.  Routine colorectal cancer screening usually begins at 66 years of age and continues through 66 years of age.  Your health care provider may recommend screening at an earlier age if you have risk factors for colon cancer.  Your health care provider may also recommend using home test kits to check for hidden blood in the stool.  A small camera at the end of a tube can be used to examine your colon directly (sigmoidoscopy or colonoscopy). This is done to check for the earliest forms of colorectal  cancer.  Routine screening usually begins at age 33.  Direct examination of the colon should be repeated every 5-10 years through 66 years of age. However, you may need to be screened more often if early forms of precancerous polyps or small growths are found.  Skin Cancer  Check your skin from head to toe regularly.  Tell your health care provider about any new moles or changes in moles, especially if there is a change in a mole's shape or color.  Also tell your health care provider if you have a mole that is larger than the size of a pencil eraser.  Always use sunscreen. Apply sunscreen liberally and repeatedly throughout the day.  Protect yourself by wearing long sleeves, pants, a wide-brimmed hat, and sunglasses whenever you are outside.  Heart disease, diabetes, and high blood pressure  High blood pressure causes heart disease and increases the risk of stroke. High blood pressure is more likely to develop in: ? People who have blood pressure in the high end of  the normal range (130-139/85-89 mm Hg). ? People who are overweight or obese. ? People who are African American.  If you are 21-29 years of age, have your blood pressure checked every 3-5 years. If you are 3 years of age or older, have your blood pressure checked every year. You should have your blood pressure measured twice-once when you are at a hospital or clinic, and once when you are not at a hospital or clinic. Record the average of the two measurements. To check your blood pressure when you are not at a hospital or clinic, you can use: ? An automated blood pressure machine at a pharmacy. ? A home blood pressure monitor.  If you are between 17 years and 37 years old, ask your health care provider if you should take aspirin to prevent strokes.  Have regular diabetes screenings. This involves taking a blood sample to check your fasting blood sugar level. ? If you are at a normal weight and have a low risk for diabetes,  have this test once every three years after 66 years of age. ? If you are overweight and have a high risk for diabetes, consider being tested at a younger age or more often. Preventing infection Hepatitis B  If you have a higher risk for hepatitis B, you should be screened for this virus. You are considered at high risk for hepatitis B if: ? You were born in a country where hepatitis B is common. Ask your health care provider which countries are considered high risk. ? Your parents were born in a high-risk country, and you have not been immunized against hepatitis B (hepatitis B vaccine). ? You have HIV or AIDS. ? You use needles to inject street drugs. ? You live with someone who has hepatitis B. ? You have had sex with someone who has hepatitis B. ? You get hemodialysis treatment. ? You take certain medicines for conditions, including cancer, organ transplantation, and autoimmune conditions.  Hepatitis C  Blood testing is recommended for: ? Everyone born from [birth year] through 1965. ? Anyone with known risk factors for hepatitis C.  Sexually transmitted infections (STIs)  You should be screened for sexually transmitted infections (STIs) including gonorrhea and chlamydia if: ? You are sexually active and are younger than 66 years of age. ? You are older than 66 years of age and your health care provider tells you that you are at risk for this type of infection. ? Your sexual activity has changed since you were last screened and you are at an increased risk for chlamydia or gonorrhea. Ask your health care provider if you are at risk.  If you do not have HIV, but are at risk, it may be recommended that you take a prescription medicine daily to prevent HIV infection. This is called pre-exposure prophylaxis (PrEP). You are considered at risk if: ? You are sexually active and do not regularly use condoms or know the HIV status of your partner(s). ? You take drugs by injection. ? You are  sexually active with a partner who has HIV.  Talk with your health care provider about whether you are at high risk of being infected with HIV. If you choose to begin PrEP, you should first be tested for HIV. You should then be tested every 3 months for as long as you are taking PrEP. Pregnancy  If you are premenopausal and you may become pregnant, ask your health care provider about preconception counseling.  If you may become  pregnant, take 400 to 800 micrograms (mcg) of folic acid every day.  If you want to prevent pregnancy, talk to your health care provider about birth control (contraception). Osteoporosis and menopause  Osteoporosis is a disease in which the bones lose minerals and strength with aging. This can result in serious bone fractures. Your risk for osteoporosis can be identified using a bone density scan.  If you are 23 years of age or older, or if you are at risk for osteoporosis and fractures, ask your health care provider if you should be screened.  Ask your health care provider whether you should take a calcium or vitamin D supplement to lower your risk for osteoporosis.  Menopause may have certain physical symptoms and risks.  Hormone replacement therapy may reduce some of these symptoms and risks. Talk to your health care provider about whether hormone replacement therapy is right for you. Follow these instructions at home:  Schedule regular health, dental, and eye exams.  Stay current with your immunizations.  Do not use any tobacco products including cigarettes, chewing tobacco, or electronic cigarettes.  If you are pregnant, do not drink alcohol.  If you are breastfeeding, limit how much and how often you drink alcohol.  Limit alcohol intake to no more than 1 drink per day for nonpregnant women. One drink equals 12 ounces of beer, 5 ounces of wine, or 1 ounces of hard liquor.  Do not use street drugs.  Do not share needles.  Ask your health care  provider for help if you need support or information about quitting drugs.  Tell your health care provider if you often feel depressed.  Tell your health care provider if you have ever been abused or do not feel safe at home. This information is not intended to replace advice given to you by your health care provider. Make sure you discuss any questions you have with your health care provider. Document Released: 02/13/2011 Document Revised: 01/06/2016 Document Reviewed: 05/04/2015 Elsevier Interactive Patient Education  2018 [employer].  Try to limit saturated fats in your diet (bologna, hot dogs, barbeque, cheeseburgers, hamburgers, steak, bacon, sausage, cheese, etc.) and get more fresh fruits, vegetables, and whole grains Try to follow the DASH guidelines (DASH stands for Dietary Approaches to Stop Hypertension). Try to limit the sodium in your diet to no more than 1,'500mg'$  of sodium per day. Certainly try to not exceed 2,000 mg per day at the very most. Do not add salt when cooking or at the table.  Check the sodium amount on labels when shopping, and choose items lower in sodium when given a choice. Avoid or limit foods that already contain a lot of sodium. Eat a diet rich in fruits and vegetables and whole grains, and try to lose weight if overweight or obese You can get the shingles vaccine at a local pharmacy, after 30 days from now  Health Maintenance  Topic Date Due  . MAMMOGRAM  07/14/2017  . DEXA SCAN  06/08/2018  . COLONOSCOPY  09/09/2022  . TETANUS/TDAP  05/13/2025  . INFLUENZA VACCINE  Completed  . PNA vac Low Risk Adult  Completed  . Hepatitis C Screening  Addressed    You have received the Pneumovax vaccine (PPSV-23) and you will not need another booster of this for the rest of your life per current ACIP guidelines

## 2017-07-13 NOTE — Assessment & Plan Note (Signed)
Wellness visit and physical done today Primary reason for today's visit was preventive care; age-appropriate health maintenance discussed, recommended tests/screenings ordered (unless declined by patient) and healthy living encouraged

## 2017-07-13 NOTE — Assessment & Plan Note (Signed)
Improving; not limiting gait; does not appear to increase fall risk

## 2017-07-13 NOTE — Assessment & Plan Note (Signed)
Next DEXA due June 09, 2019; discussed calcium intake, vit D, fall precautions

## 2017-07-13 NOTE — Assessment & Plan Note (Signed)
Continue current medicine; limit saturated fats, encourged whole grains; refill of statin provided; check lipids in January

## 2017-07-17 LAB — PAP IG AND HPV HIGH-RISK: HPV DNA High Risk: NOT DETECTED

## 2017-07-20 DIAGNOSIS — Z1231 Encounter for screening mammogram for malignant neoplasm of breast: Secondary | ICD-10-CM | POA: Diagnosis not present

## 2017-07-20 LAB — HM MAMMOGRAPHY

## 2017-09-10 DIAGNOSIS — Z85828 Personal history of other malignant neoplasm of skin: Secondary | ICD-10-CM | POA: Diagnosis not present

## 2017-09-10 DIAGNOSIS — Z7982 Long term (current) use of aspirin: Secondary | ICD-10-CM | POA: Diagnosis not present

## 2017-09-10 DIAGNOSIS — E785 Hyperlipidemia, unspecified: Secondary | ICD-10-CM | POA: Diagnosis not present

## 2017-09-10 DIAGNOSIS — I10 Essential (primary) hypertension: Secondary | ICD-10-CM | POA: Diagnosis not present

## 2017-10-12 ENCOUNTER — Other Ambulatory Visit: Payer: Self-pay

## 2017-10-12 DIAGNOSIS — E782 Mixed hyperlipidemia: Secondary | ICD-10-CM

## 2017-10-12 DIAGNOSIS — Z5181 Encounter for therapeutic drug level monitoring: Secondary | ICD-10-CM

## 2017-10-12 LAB — LIPID PANEL
Cholesterol: 193 mg/dL (ref <200)
HDL: 86 mg/dL (ref >50)
LDL Cholesterol (Calc): 80 mg/dL (calc)
Non-HDL Cholesterol (Calc): 107 mg/dL (calc) (ref <130)
Total CHOL/HDL Ratio: 2.2 (calc) (ref <5.0)
Triglycerides: 160 mg/dL — ABNORMAL HIGH (ref <150)

## 2017-10-12 LAB — COMPLETE METABOLIC PANEL WITH GFR
AG Ratio: 2.1 (calc) (ref 1.0–2.5)
ALT: 20 U/L (ref 6–29)
AST: 21 U/L (ref 10–35)
Albumin: 4.4 g/dL (ref 3.6–5.1)
Alkaline phosphatase (APISO): 78 U/L (ref 33–130)
BUN: 23 mg/dL (ref 7–25)
CO2: 30 mmol/L (ref 20–32)
Calcium: 9.5 mg/dL (ref 8.6–10.4)
Chloride: 102 mmol/L (ref 98–110)
Creat: 0.75 mg/dL (ref 0.50–0.99)
GFR, Est African American: 96 mL/min/{1.73_m2} (ref >=60)
GFR, Est Non African American: 82 mL/min/{1.73_m2} (ref >=60)
Globulin: 2.1 g/dL (calc) (ref 1.9–3.7)
Glucose, Bld: 86 mg/dL (ref 65–99)
Potassium: 4.2 mmol/L (ref 3.5–5.3)
Sodium: 140 mmol/L (ref 135–146)
Total Bilirubin: 0.4 mg/dL (ref 0.2–1.2)
Total Protein: 6.5 g/dL (ref 6.1–8.1)

## 2017-10-16 ENCOUNTER — Other Ambulatory Visit: Payer: Self-pay | Admitting: Family Medicine

## 2017-10-16 MED ORDER — LOVASTATIN 20 MG PO TABS: 20.0000 mg | ORAL_TABLET | Freq: Every day | ORAL | 3 refills | Status: DC

## 2017-10-16 MED ORDER — LISINOPRIL-HYDROCHLOROTHIAZIDE 20-25 MG PO TABS: 1.0000 | ORAL_TABLET | Freq: Every day | ORAL | 3 refills | Status: DC

## 2017-10-16 NOTE — Progress Notes (Signed)
One year of refills of her statin approved

## 2017-10-16 NOTE — Progress Notes (Signed)
One year of chol med and BP med approved today after reviewing labs

## 2017-10-17 ENCOUNTER — Telehealth: Payer: Self-pay

## 2017-10-17 NOTE — Telephone Encounter (Signed)
Called pt no answer. LM for pt informing her of lab results. CRM created. [facility] routed labs.

## 2017-10-17 NOTE — Telephone Encounter (Signed)
-----   Message from Arnetha Courser, MD sent at 10/16/2017  9:37 PM EST ----- Please let the patient know that her cholesterol panel looks very good. I've sent in a year's worth of refills of her cholesterol medicine. Her glucose and kidney function and liver function tests are all in the expected ranges. I've sent in a year's worth of refills of her blood pressure medicine too. Thank you

## 2017-10-19 ENCOUNTER — Telehealth: Payer: Self-pay | Admitting: Family Medicine

## 2017-10-19 NOTE — Telephone Encounter (Signed)
Called pt to let her know still on backorder and unable to get

## 2017-10-19 NOTE — Telephone Encounter (Signed)
Copied from [place]. Topic: Quick Communication - See Telephone Encounter >> Oct 19, 2017  8:47 AM Robina Ade, Helene Kelp D wrote: CRM for notification. See Telephone encounter for: 10/19/17. Patient called and said that she would like to have her shingle vaccine in the office. Patient would like to talk to Dr. Sanda Klein CMA about this. Please call patient back, thanks.

## 2017-12-07 DIAGNOSIS — D2272 Melanocytic nevi of left lower limb, including hip: Secondary | ICD-10-CM | POA: Diagnosis not present

## 2017-12-07 DIAGNOSIS — Z85828 Personal history of other malignant neoplasm of skin: Secondary | ICD-10-CM | POA: Diagnosis not present

## 2017-12-07 DIAGNOSIS — D225 Melanocytic nevi of trunk: Secondary | ICD-10-CM | POA: Diagnosis not present

## 2017-12-07 DIAGNOSIS — D2262 Melanocytic nevi of left upper limb, including shoulder: Secondary | ICD-10-CM | POA: Diagnosis not present

## 2017-12-07 DIAGNOSIS — D2271 Melanocytic nevi of right lower limb, including hip: Secondary | ICD-10-CM | POA: Diagnosis not present

## 2017-12-07 DIAGNOSIS — D2261 Melanocytic nevi of right upper limb, including shoulder: Secondary | ICD-10-CM | POA: Diagnosis not present

## 2017-12-07 DIAGNOSIS — L821 Other seborrheic keratosis: Secondary | ICD-10-CM | POA: Diagnosis not present

## 2017-12-07 DIAGNOSIS — L570 Actinic keratosis: Secondary | ICD-10-CM | POA: Diagnosis not present

## 2017-12-07 DIAGNOSIS — Z08 Encounter for follow-up examination after completed treatment for malignant neoplasm: Secondary | ICD-10-CM | POA: Diagnosis not present

## 2017-12-07 DIAGNOSIS — D0461 Carcinoma in situ of skin of right upper limb, including shoulder: Secondary | ICD-10-CM | POA: Diagnosis not present

## 2017-12-07 DIAGNOSIS — D485 Neoplasm of uncertain behavior of skin: Secondary | ICD-10-CM | POA: Diagnosis not present

## 2018-01-02 DIAGNOSIS — R69 Illness, unspecified: Secondary | ICD-10-CM | POA: Diagnosis not present

## 2018-01-11 ENCOUNTER — Encounter: Payer: Self-pay | Admitting: Family Medicine

## 2018-01-11 ENCOUNTER — Ambulatory Visit (INDEPENDENT_AMBULATORY_CARE_PROVIDER_SITE_OTHER): Payer: Medicare HMO | Admitting: Family Medicine

## 2018-01-11 DIAGNOSIS — Z789 Other specified health status: Secondary | ICD-10-CM | POA: Diagnosis not present

## 2018-01-11 DIAGNOSIS — E782 Mixed hyperlipidemia: Secondary | ICD-10-CM | POA: Diagnosis not present

## 2018-01-11 DIAGNOSIS — I10 Essential (primary) hypertension: Secondary | ICD-10-CM | POA: Diagnosis not present

## 2018-01-11 DIAGNOSIS — M8588 Other specified disorders of bone density and structure, other site: Secondary | ICD-10-CM | POA: Diagnosis not present

## 2018-01-11 NOTE — Assessment & Plan Note (Signed)
Discussed at length; she would desire resuscitative efforts in current healthy state; reviewed all the aspects on her desire for natural death; would not want life-prolonging measure if terminable/incurable; I support

## 2018-01-11 NOTE — Assessment & Plan Note (Signed)
Last done October 2018; next due Oct 2020; practice fall precautions; calcium BID

## 2018-01-11 NOTE — Assessment & Plan Note (Signed)
DASH guidelines; continue medicine

## 2018-01-11 NOTE — Progress Notes (Signed)
BP 132/84 (BP Location: Right Arm, Patient Position: Sitting, Cuff Size: Normal)   Pulse 74   Temp 98.6 F (37 C) (Oral)   Resp 16   Ht 5\' 2"  (1.575 m)   Wt 142 lb 12.8 oz (64.8 kg)   SpO2 96%   BMI 26.12 kg/m    Subjective:    Patient ID: Carmen Clarke, female    DOB: 1951-07-04, 67 y.o.   MRN: [id number]  HPI: Carmen Clarke is a 67 y.o. female  Chief Complaint  Patient presents with  . Follow-up    6 month recheck  . Hyperlipidemia  . Hypertension    HPI Patient is here for regular follow-up  High blood pressure; not checking at home; may check it at the fire department once in a while; nothing worrisome now that she's on medicine; now on medicine, doing well; was going to the fire dept every few days for a while and they told her to just breathe and then finally went to the doctor and got put on medicine  High cholesterol; taking statin; not eating fatty meats; doesn't eat bacon and doesn't cook it; not much cheese, even on hamburgers; likes macaroni and cheese if [employer]; likes eggs boiled, just eats the white part unless deviled eggs (not often)  Reviewed living will and HCPOA with her; she is full code unless terminal or incurable  Overweight; gained a little weight; no one in the family with thyroid trouble; no trouble fitting in her clothes; cannot swallow when laying on her back; sleeps with one pillow, pretty full pillow; sleeps on her side; not choking on things during the night; does sometimes during the day; had her throat stretched a few years ago; offered/suggested referral but she declined today, going to [place]  Osteopenia; taking calcium; fall precautions  Depression screen [facility] 2/9 01/11/2018 07/13/2017 06/30/2016 09/24/2015 05/14/2015  Decreased Interest 0 0 0 0 0  Down, Depressed, Hopeless 0 0 0 0 0  PHQ - 2 Score 0 0 0 0 0   Fall Risk  01/11/2018 07/13/2017 06/30/2016 09/24/2015  Falls in the past year? Yes Yes No No  Number falls in past yr: 1 1  - -  Injury with Fall? - Yes - -  March 13, 2017; no falls since; went to Dr. Harlow Mares urgent care and broke her knee cap  Relevant past medical, surgical, family and social history reviewed Past Medical History:  Diagnosis Date  . Fracture of patella, left, closed 03/28/2017   2018  . Hyperlipidemia   . Hypertension   . Menopausal symptoms    previous HRT  . Osteopenia   . Vitamin D deficiency disease    Past Surgical History:  Procedure Laterality Date  . FEMUR FRACTURE SURGERY    . FOOT SURGERY  Aug. 2016  . HIP FRACTURE SURGERY    . knee arthoscopy Left    partial medial and lateral meniscetomy with removal of deep hardware from left distal femur   Family History  Problem Relation Age of Onset  . Diabetes Mother   . Hypertension Mother   . Hypertension Father   . Cancer Neg Hx   . COPD Neg Hx   . Heart disease Neg Hx   . Stroke Neg Hx    Social History   Tobacco Use  . Smoking status: Never Smoker  . Smokeless tobacco: Never Used  Substance Use Topics  . Alcohol use: No    Alcohol/week: 0.0 oz  . Drug  use: No    Interim medical history since last visit reviewed. Allergies and medications reviewed  Review of Systems Per HPI unless specifically indicated above     Objective:    BP 132/84 (BP Location: Right Arm, Patient Position: Sitting, Cuff Size: Normal)   Pulse 74   Temp 98.6 F (37 C) (Oral)   Resp 16   Ht 5\' 2"  (1.575 m)   Wt 142 lb 12.8 oz (64.8 kg)   SpO2 96%   BMI 26.12 kg/m   Wt Readings from Last 3 Encounters:  01/11/18 142 lb 12.8 oz (64.8 kg)  07/13/17 138 lb 9.6 oz (62.9 kg)  06/30/16 139 lb 5 oz (63.2 kg)    Physical Exam  Constitutional: She appears well-developed and well-nourished. No distress.  HENT:  Head: Normocephalic and atraumatic.  Eyes: EOM are normal. No scleral icterus.  Neck: No thyromegaly present.  Cardiovascular: Normal rate, regular rhythm and normal heart sounds.  No murmur heard. Pulmonary/Chest: Effort  normal and breath sounds normal. No respiratory distress. She has no wheezes.  Abdominal: Soft. Bowel sounds are normal. She exhibits no distension.  Musculoskeletal: She exhibits no edema.  Neurological: She is alert. She exhibits normal muscle tone.  Skin: Skin is warm and dry. She is not diaphoretic. No pallor.  Psychiatric: She has a normal mood and affect. Her behavior is normal. Judgment and thought content normal.    Results for orders placed or performed in visit on 10/12/17  Lipid panel  Result Value Ref Range   Cholesterol 193 <200 mg/dL   HDL 86 >50 mg/dL   Triglycerides 160 (H) <150 mg/dL   LDL Cholesterol (Calc) 80 mg/dL (calc)   Total CHOL/HDL Ratio 2.2 <5.0 (calc)   Non-HDL Cholesterol (Calc) 107 <130 mg/dL (calc)  COMPLETE METABOLIC PANEL WITH GFR  Result Value Ref Range   Glucose, Bld 86 65 - 99 mg/dL   BUN 23 7 - 25 mg/dL   Creat 0.75 0.50 - 0.99 mg/dL   GFR, Est Non African American 82 > OR = 60 mL/min/1.11m2   GFR, Est African American 96 > OR = 60 mL/min/1.38m2   BUN/Creatinine Ratio NOT APPLICABLE 6 - 22 (calc)   Sodium 140 135 - 146 mmol/L   Potassium 4.2 3.5 - 5.3 mmol/L   Chloride 102 98 - 110 mmol/L   CO2 30 20 - 32 mmol/L   Calcium 9.5 8.6 - 10.4 mg/dL   Total Protein 6.5 6.1 - 8.1 g/dL   Albumin 4.4 3.6 - 5.1 g/dL   Globulin 2.1 1.9 - 3.7 g/dL (calc)   AG Ratio 2.1 1.0 - 2.5 (calc)   Total Bilirubin 0.4 0.2 - 1.2 mg/dL   Alkaline phosphatase (APISO) 78 33 - 130 U/L   AST 21 10 - 35 U/L   ALT 20 6 - 29 U/L      Assessment & Plan:   Problem List Items Addressed This Visit      Cardiovascular and Mediastinum   Hypertension (Chronic)    DASH guidelines; continue medicine        Musculoskeletal and Integument   Osteopenia (Chronic)    Last done October 2018; next due Oct 2020; practice fall precautions; calcium BID        Other   Hyperlipidemia (Chronic)    Checked lipids in March; try to limit saturated fats; will switch to simvastatin  20 mg when she finishes out her current lovastatin      Full code status  Discussed at length; she would desire resuscitative efforts in current healthy state; reviewed all the aspects on her desire for natural death; would not want life-prolonging measure if terminable/incurable; I support      Relevant Orders   Full code      Follow up plan: Return in about 6 months (around 07/13/2018) for twenty minute follow-up with fasting labs.  An after-visit summary was printed and given to the patient at [place].  Please see the patient instructions which may contain other information and recommendations beyond what is mentioned above in the assessment and plan.  No orders of the defined types were placed in this encounter.   Orders Placed This Encounter  Procedures  . Full code    Face-to-face time with patient was more than 25 minutes, >50% time spent counseling and coordination of care

## 2018-01-11 NOTE — Patient Instructions (Addendum)
Finish out the rest of the lovastatin About a week before you run out, contact me and we'll switch to simvastatin 20 mg at bedtime Take the paper back to your pharmacy to see if they will give you some of your money back because the lisinopril/hctz 20/25 is on the list  Try to limit saturated fats in your diet (bologna, hot dogs, barbeque, cheeseburgers, hamburgers, steak, bacon, sausage, cheese, etc.) and get more fresh fruits, vegetables, and whole grains  Try to follow the DASH guidelines (DASH stands for Dietary Approaches to Stop Hypertension). Try to limit the sodium in your diet to no more than 1,500mg  of sodium per day. Certainly try to not exceed 2,000 mg per day at the very most. Do not add salt when cooking or at the table.  Check the sodium amount on labels when shopping, and choose items lower in sodium when given a choice. Avoid or limit foods that already contain a lot of sodium. Eat a diet rich in fruits and vegetables and whole grains, and try to lose weight if overweight or obese  Let me know when you are ready to see a gastroenterologist for your esophagus; choking can lead to aspiration and pneumonia

## 2018-01-11 NOTE — Assessment & Plan Note (Addendum)
Checked lipids in March; try to limit saturated fats; will switch to simvastatin 20 mg when she finishes out her current lovastatin

## 2018-01-22 DIAGNOSIS — D0461 Carcinoma in situ of skin of right upper limb, including shoulder: Secondary | ICD-10-CM | POA: Diagnosis not present

## 2018-05-16 ENCOUNTER — Ambulatory Visit (INDEPENDENT_AMBULATORY_CARE_PROVIDER_SITE_OTHER): Payer: Medicare HMO

## 2018-05-16 DIAGNOSIS — Z23 Encounter for immunization: Secondary | ICD-10-CM

## 2018-05-17 DIAGNOSIS — D2261 Melanocytic nevi of right upper limb, including shoulder: Secondary | ICD-10-CM | POA: Diagnosis not present

## 2018-05-17 DIAGNOSIS — X32XXXA Exposure to sunlight, initial encounter: Secondary | ICD-10-CM | POA: Diagnosis not present

## 2018-05-17 DIAGNOSIS — D2271 Melanocytic nevi of right lower limb, including hip: Secondary | ICD-10-CM | POA: Diagnosis not present

## 2018-05-17 DIAGNOSIS — L570 Actinic keratosis: Secondary | ICD-10-CM | POA: Diagnosis not present

## 2018-05-17 DIAGNOSIS — Z85828 Personal history of other malignant neoplasm of skin: Secondary | ICD-10-CM | POA: Diagnosis not present

## 2018-05-17 DIAGNOSIS — Z08 Encounter for follow-up examination after completed treatment for malignant neoplasm: Secondary | ICD-10-CM | POA: Diagnosis not present

## 2018-05-17 DIAGNOSIS — D225 Melanocytic nevi of trunk: Secondary | ICD-10-CM | POA: Diagnosis not present

## 2018-05-17 DIAGNOSIS — D2272 Melanocytic nevi of left lower limb, including hip: Secondary | ICD-10-CM | POA: Diagnosis not present

## 2018-05-17 DIAGNOSIS — D2262 Melanocytic nevi of left upper limb, including shoulder: Secondary | ICD-10-CM | POA: Diagnosis not present

## 2018-06-14 ENCOUNTER — Ambulatory Visit: Payer: Medicare HMO | Admitting: Family Medicine

## 2018-07-05 ENCOUNTER — Encounter: Payer: Self-pay | Admitting: Family Medicine

## 2018-07-05 ENCOUNTER — Ambulatory Visit (INDEPENDENT_AMBULATORY_CARE_PROVIDER_SITE_OTHER): Payer: Medicare HMO | Admitting: Family Medicine

## 2018-07-05 VITALS — BP 118/74 | HR 71 | Temp 98.1°F | Ht 62.0 in | Wt 141.0 lb

## 2018-07-05 DIAGNOSIS — I10 Essential (primary) hypertension: Secondary | ICD-10-CM

## 2018-07-05 DIAGNOSIS — M8588 Other specified disorders of bone density and structure, other site: Secondary | ICD-10-CM | POA: Diagnosis not present

## 2018-07-05 DIAGNOSIS — Z5181 Encounter for therapeutic drug level monitoring: Secondary | ICD-10-CM | POA: Diagnosis not present

## 2018-07-05 DIAGNOSIS — E559 Vitamin D deficiency, unspecified: Secondary | ICD-10-CM | POA: Diagnosis not present

## 2018-07-05 DIAGNOSIS — E782 Mixed hyperlipidemia: Secondary | ICD-10-CM

## 2018-07-05 DIAGNOSIS — Z1239 Encounter for other screening for malignant neoplasm of breast: Secondary | ICD-10-CM | POA: Diagnosis not present

## 2018-07-05 MED ORDER — LOVASTATIN 20 MG PO TABS: 20.0000 mg | ORAL_TABLET | Freq: Every day | ORAL | Status: DC

## 2018-07-05 NOTE — Assessment & Plan Note (Signed)
Check -lytes, Cr, SGPT

## 2018-07-05 NOTE — Assessment & Plan Note (Signed)
Next due Oct 2020; fall reduction and weight-bearing exercise discussed

## 2018-07-05 NOTE — Patient Instructions (Addendum)
Try to follow the DASH guidelines (DASH stands for Dietary Approaches to Stop Hypertension). Try to limit the sodium in your diet to no more than 1,500mg  of sodium per day. Certainly try to not exceed 2,000 mg per day at the very most. Do not add salt when cooking or at the table.  Check the sodium amount on labels when shopping, and choose items lower in sodium when given a choice. Avoid or limit foods that already contain a lot of sodium. Eat a diet rich in fruits and vegetables and whole grains, and try to lose weight if overweight or obese  Try to limit saturated fats in your diet (bologna, hot dogs, barbeque, cheeseburgers, hamburgers, steak, bacon, sausage, cheese, etc.) and get more fresh fruits, vegetables, and whole grains   DASH Eating Plan DASH stands for "Dietary Approaches to Stop Hypertension." The DASH eating plan is a healthy eating plan that has been shown to reduce high blood pressure (hypertension). It may also reduce your risk for type 2 diabetes, heart disease, and stroke. The DASH eating plan may also help with weight loss. What are tips for following this plan? General guidelines  Avoid eating more than 2,300 mg (milligrams) of salt (sodium) a day. If you have hypertension, you may need to reduce your sodium intake to 1,500 mg a day.  Limit alcohol intake to no more than 1 drink a day for nonpregnant women and 2 drinks a day for men. One drink equals 12 oz of beer, 5 oz of wine, or 1 oz of hard liquor.  Work with your health care provider to maintain a healthy body weight or to lose weight. Ask what an ideal weight is for you.  Get at least 30 minutes of exercise that causes your heart to beat faster (aerobic exercise) most days of the week. Activities may include walking, swimming, or biking.  Work with your health care provider or diet and nutrition specialist (dietitian) to adjust your eating plan to your individual calorie needs. Reading food labels  Check food labels  for the amount of sodium per serving. Choose foods with less than 5 percent of the Daily Value of sodium. Generally, foods with less than 300 mg of sodium per serving fit into this eating plan.  To find whole grains, look for the word "whole" as the first word in the ingredient list. Shopping  Buy products labeled as "low-sodium" or "no salt added."  Buy fresh foods. Avoid canned foods and premade or frozen meals. Cooking  Avoid adding salt when cooking. Use salt-free seasonings or herbs instead of table salt or sea salt. Check with your health care provider or pharmacist before using salt substitutes.  Do not fry foods. Cook foods using healthy methods such as baking, boiling, grilling, and broiling instead.  Cook with heart-healthy oils, such as olive, canola, soybean, or sunflower oil. Meal planning   Eat a balanced diet that includes: ? 5 or more servings of fruits and vegetables each day. At each meal, try to fill half of your plate with fruits and vegetables. ? Up to 6-8 servings of whole grains each day. ? Less than 6 oz of lean meat, poultry, or fish each day. A 3-oz serving of meat is about the same size as a deck of cards. One egg equals 1 oz. ? 2 servings of low-fat dairy each day. ? A serving of nuts, seeds, or beans 5 times each week. ? Heart-healthy fats. Healthy fats called Omega-3 fatty acids are found  in foods such as flaxseeds and coldwater fish, like sardines, salmon, and mackerel.  Limit how much you eat of the following: ? Canned or prepackaged foods. ? Food that is high in trans fat, such as fried foods. ? Food that is high in saturated fat, such as fatty meat. ? Sweets, desserts, sugary drinks, and other foods with added sugar. ? Full-fat dairy products.  Do not salt foods before eating.  Try to eat at least 2 vegetarian meals each week.  Eat more home-cooked food and less restaurant, buffet, and fast food.  When eating at a restaurant, ask that your food  be prepared with less salt or no salt, if possible. What foods are recommended? The items listed may not be a complete list. Talk with your dietitian about what dietary choices are best for you. Grains Whole-grain or whole-wheat bread. Whole-grain or whole-wheat pasta. Brown rice. [place]. Bulgur. Whole-grain and low-sodium cereals. Pita bread. Low-fat, low-sodium crackers. Whole-wheat flour tortillas. Vegetables Fresh or frozen vegetables (raw, steamed, roasted, or grilled). Low-sodium or reduced-sodium tomato and vegetable juice. Low-sodium or reduced-sodium tomato sauce and tomato paste. Low-sodium or reduced-sodium canned vegetables. Fruits All fresh, dried, or frozen fruit. Canned fruit in natural juice (without added sugar). Meat and other protein foods Skinless chicken or [place]. Ground chicken or [place]. Pork with fat trimmed off. Fish and seafood. Egg whites. Dried beans, peas, or lentils. Unsalted nuts, nut butters, and seeds. Unsalted canned beans. Lean cuts of beef with fat trimmed off. Low-sodium, lean deli meat. Dairy Low-fat (1%) or fat-free (skim) milk. Fat-free, low-fat, or reduced-fat cheeses. Nonfat, low-sodium ricotta or cottage cheese. Low-fat or nonfat yogurt. Low-fat, low-sodium cheese. Fats and oils Soft margarine without trans fats. Vegetable oil. Low-fat, reduced-fat, or light mayonnaise and salad dressings (reduced-sodium). Canola, safflower, olive, soybean, and sunflower oils. Avocado. Seasoning and other foods Herbs. Spices. Seasoning mixes without salt. Unsalted popcorn and pretzels. Fat-free sweets. What foods are not recommended? The items listed may not be a complete list. Talk with your dietitian about what dietary choices are best for you. Grains Baked goods made with fat, such as croissants, muffins, or some breads. Dry pasta or rice meal packs. Vegetables Creamed or fried vegetables. Vegetables in a cheese sauce. Regular canned vegetables (not  low-sodium or reduced-sodium). Regular canned tomato sauce and paste (not low-sodium or reduced-sodium). Regular tomato and vegetable juice (not low-sodium or reduced-sodium). Angie Fava. Olives. Fruits Canned fruit in a light or heavy syrup. Fried fruit. Fruit in cream or butter sauce. Meat and other protein foods Fatty cuts of meat. Ribs. Fried meat. Berniece Salines. Sausage. Bologna and other processed lunch meats. Salami. Fatback. Hotdogs. Bratwurst. Salted nuts and seeds. Canned beans with added salt. Canned or smoked fish. Whole eggs or egg yolks. Chicken or [place] with skin. Dairy Whole or 2% milk, cream, and half-and-half. Whole or full-fat cream cheese. Whole-fat or sweetened yogurt. Full-fat cheese. Nondairy creamers. Whipped toppings. Processed cheese and cheese spreads. Fats and oils Butter. Stick margarine. Lard. Shortening. Ghee. Bacon fat. Tropical oils, such as coconut, palm kernel, or palm oil. Seasoning and other foods Salted popcorn and pretzels. Onion salt, garlic salt, seasoned salt, table salt, and sea salt. Worcestershire sauce. Tartar sauce. Barbecue sauce. Teriyaki sauce. Soy sauce, including reduced-sodium. Steak sauce. Canned and packaged gravies. Fish sauce. Oyster sauce. Cocktail sauce. Horseradish that you find on the shelf. Ketchup. Mustard. Meat flavorings and tenderizers. Bouillon cubes. Hot sauce and Tabasco sauce. Premade or packaged marinades. Premade or packaged taco seasonings. Relishes.  Regular salad dressings. Where to find more information:  National Heart, Lung, and [place]: https://wilson-eaton.com/  American Heart Association: www.heart.org Summary  The DASH eating plan is a healthy eating plan that has been shown to reduce high blood pressure (hypertension). It may also reduce your risk for type 2 diabetes, heart disease, and stroke.  With the DASH eating plan, you should limit salt (sodium) intake to 2,300 mg a day. If you have hypertension, you may need to reduce  your sodium intake to 1,500 mg a day.  When on the DASH eating plan, aim to eat more fresh fruits and vegetables, whole grains, lean proteins, low-fat dairy, and heart-healthy fats.  Work with your health care provider or diet and nutrition specialist (dietitian) to adjust your eating plan to your individual calorie needs. This information is not intended to replace advice given to you by your health care provider. Make sure you discuss any questions you have with your health care provider. Document Released: 07/20/2011 Document Revised: 07/24/2016 Document Reviewed: 07/24/2016 Elsevier Interactive Patient Education  2018 [place] have OSTEOPENIA, not Osteoporosis Osteoporosis happens when your bones become thinner and weaker. Weak bones can break (fracture) more easily when you slip or fall. Bones most at risk of breaking are in the hip, wrist, and spine. Follow these instructions at home:  Get enough calcium and vitamin D. These nutrients are good for your bones.  Exercise as told by your doctor.  Do not use any tobacco products. This includes cigarettes, chewing tobacco, and electronic cigarettes. If you need help quitting, ask your doctor.  Limit the amount of alcohol you drink.  Take medicines only as told by your doctor.  Keep all follow-up visits as told by your doctor. This is important.  Take care at home to prevent falls. Some ways to do this are: ? Keep rooms well lit and tidy. ? Put safety rails on your stairs. ? Put a rubber mat in the bathroom and other places that are often wet or slippery. Get help right away if:  You fall.  You hurt yourself. This information is not intended to replace advice given to you by your health care provider. Make sure you discuss any questions you have with your health care provider. Document Released: 10/23/2011 Document Revised: 01/06/2016 Document Reviewed: 01/08/2014 Elsevier Interactive Patient Education  2018  [employer].  Cholesterol Cholesterol is a fat. Your body needs a small amount of cholesterol. Cholesterol (plaque) may build up in your blood vessels (arteries). That makes you more likely to have a heart attack or stroke. You cannot feel your cholesterol level. Having a blood test is the only way to find out if your level is high. Keep your test results. Work with your doctor to keep your cholesterol at a good level. What do the results mean?  Total cholesterol is how much cholesterol is in your blood.  LDL is bad cholesterol. This is the type that can build up. Try to have low LDL.  HDL is good cholesterol. It cleans your blood vessels and carries LDL away. Try to have high HDL.  Triglycerides are fat that the body can store or burn for energy. What are good levels of cholesterol?  Total cholesterol below 200.  LDL below 100 is good for people who have health risks. LDL below 70 is good for people who have very high risks.  HDL above 40 is good. It is best to have HDL of 60 or higher.  Triglycerides below 150. How can I lower my cholesterol? Diet Follow your diet program as told by your doctor.  Choose fish, white meat chicken, or [place] that is roasted or baked. Try not to eat red meat, fried foods, sausage, or lunch meats.  Eat lots of fresh fruits and vegetables.  Choose whole grains, beans, pasta, potatoes, and cereals.  Choose olive oil, corn oil, or canola oil. Only use small amounts.  Try not to eat butter, mayonnaise, shortening, or palm kernel oils.  Try not to eat foods with trans fats.  Choose low-fat or nonfat dairy foods. ? Drink skim or nonfat milk. ? Eat low-fat or nonfat yogurt and cheeses. ? Try not to drink whole milk or cream. ? Try not to eat ice cream, egg yolks, or full-fat cheeses.  Healthy desserts include angel food cake, ginger snaps, animal crackers, hard candy, popsicles, and low-fat or nonfat frozen yogurt. Try not to eat pastries, cakes,  pies, and cookies.  Exercise Follow your exercise program as told by your doctor.  Be more active. Try gardening, walking, and taking the stairs.  Ask your doctor about ways that you can be more active.  Medicine  Take over-the-counter and prescription medicines only as told by your doctor. This information is not intended to replace advice given to you by your health care provider. Make sure you discuss any questions you have with your health care provider. Document Released: 10/27/2008 Document Revised: 03/01/2016 Document Reviewed: 02/10/2016 Elsevier Interactive Patient Education  [employer].

## 2018-07-05 NOTE — Assessment & Plan Note (Signed)
Well-controlled; discussed DASH guidelines; sodium reduction; continue medicine

## 2018-07-05 NOTE — Progress Notes (Signed)
BP 118/74   Pulse 71   Temp 98.1 F (36.7 C) (Oral)   Ht 5\' 2"  (1.575 m)   Wt 141 lb (64 kg)   SpO2 98%   BMI 25.79 kg/m    Subjective:    Patient ID: Carmen Clarke, female    DOB: 1951-03-06, 67 y.o.   MRN: [id number]  HPI: Carmen Clarke is a 67 y.o. female  Chief Complaint  Patient presents with  . Follow-up    HPI Patient is here for f/u  She used to get her prescriptions for $10 a piece She has lovastatin 20 currently but it is more expensive now; would like to switch She has not eaten real bacon in so long she cannot remember what it tastes like; occasional [place] bacon; very rarely eats hot dogs; eating better after her husband's heart problem; getting plenty of grains, Cheerios  Hypertension; on the combo medicine ACE-I and thiazide; checks BP away from the doctor; it's "always good"; not adding much salt to her food  She did get her shingles vaccines, both are now UTD, June and August  Due for mammogram on or after Dec 7th; [facility]; needs orders; she will schedule; no lumps or bumps  Osteopenia; she used to go to the gym until her husband couldn't go; she does walk; green leafy veggies, salad every to every other night  Lab Results  Component Value Date   CHOL 193 10/12/2017   HDL 86 10/12/2017   LDLCALC 80 10/12/2017   LDLDIRECT 74 04/14/2016   TRIG 160 (H) 10/12/2017   CHOLHDL 2.2 10/12/2017    Vitamin D deficiency; no recent labs; spends some time outside, not a lot  Depression screen [facility] 2/9 07/05/2018 01/11/2018 07/13/2017 06/30/2016 09/24/2015  Decreased Interest 0 0 0 0 0  Down, Depressed, Hopeless 0 0 0 0 0  PHQ - 2 Score 0 0 0 0 0  Altered sleeping 0 - - - -  Tired, decreased energy 0 - - - -  Change in appetite 0 - - - -  Feeling bad or failure about yourself  0 - - - -  Trouble concentrating 0 - - - -  Moving slowly or fidgety/restless 0 - - - -  Suicidal thoughts 0 - - - -  PHQ-9 Score 0 - - - -  Difficult doing work/chores Not  difficult at all - - - -   Fall Risk  07/05/2018 01/11/2018 07/13/2017 06/30/2016 09/24/2015  Falls in the past year? 0 Yes Yes No No  Number falls in past yr: 0 1 1 - -  Injury with Fall? 0 - Yes - -    Relevant past medical, surgical, family and social history reviewed Past Medical History:  Diagnosis Date  . Fracture of patella, left, closed 03/28/2017   2018  . Hyperlipidemia   . Hypertension   . Menopausal symptoms    previous HRT  . Osteopenia   . Vitamin D deficiency disease    Past Surgical History:  Procedure Laterality Date  . FEMUR FRACTURE SURGERY    . FOOT SURGERY  Aug. 2016  . HIP FRACTURE SURGERY    . knee arthoscopy Left    partial medial and lateral meniscetomy with removal of deep hardware from left distal femur   Family History  Problem Relation Age of Onset  . Diabetes Mother   . Hypertension Mother   . Hypertension Father   . Cancer Neg Hx   . COPD Neg Hx   .  Heart disease Neg Hx   . Stroke Neg Hx    Social History   Tobacco Use  . Smoking status: Never Smoker  . Smokeless tobacco: Never Used  Substance Use Topics  . Alcohol use: No    Alcohol/week: 0.0 standard drinks  . Drug use: No     Office Visit from 07/05/2018 in [facility]  AUDIT-C Score  0      Interim medical history since last visit reviewed. Allergies and medications reviewed  Review of Systems Per HPI unless specifically indicated above     Objective:    BP 118/74   Pulse 71   Temp 98.1 F (36.7 C) (Oral)   Ht 5\' 2"  (1.575 m)   Wt 141 lb (64 kg)   SpO2 98%   BMI 25.79 kg/m   Wt Readings from Last 3 Encounters:  07/05/18 141 lb (64 kg)  01/11/18 142 lb 12.8 oz (64.8 kg)  07/13/17 138 lb 9.6 oz (62.9 kg)    Physical Exam  Constitutional: She appears well-developed and well-nourished. No distress.  HENT:  Head: Normocephalic and atraumatic.  Eyes: EOM are normal. No scleral icterus.  Neck: No thyromegaly present.  Cardiovascular: Normal  rate, regular rhythm and normal heart sounds.  No murmur heard. Pulmonary/Chest: Effort normal and breath sounds normal. No respiratory distress. She has no wheezes.  Abdominal: Soft. Bowel sounds are normal. She exhibits no distension.  Musculoskeletal: Normal range of motion. She exhibits no edema.  Neurological: She is alert. She exhibits normal muscle tone.  Skin: Skin is warm and dry. She is not diaphoretic. No pallor.  Psychiatric: She has a normal mood and affect. Her behavior is normal. Judgment and thought content normal.    Results for orders placed or performed in visit on 10/12/17  Lipid panel  Result Value Ref Range   Cholesterol 193 <200 mg/dL   HDL 86 >50 mg/dL   Triglycerides 160 (H) <150 mg/dL   LDL Cholesterol (Calc) 80 mg/dL (calc)   Total CHOL/HDL Ratio 2.2 <5.0 (calc)   Non-HDL Cholesterol (Calc) 107 <130 mg/dL (calc)  COMPLETE METABOLIC PANEL WITH GFR  Result Value Ref Range   Glucose, Bld 86 65 - 99 mg/dL   BUN 23 7 - 25 mg/dL   Creat 0.75 0.50 - 0.99 mg/dL   GFR, Est Non African American 82 > OR = 60 mL/min/1.37m2   GFR, Est African American 96 > OR = 60 mL/min/1.30m2   BUN/Creatinine Ratio NOT APPLICABLE 6 - 22 (calc)   Sodium 140 135 - 146 mmol/L   Potassium 4.2 3.5 - 5.3 mmol/L   Chloride 102 98 - 110 mmol/L   CO2 30 20 - 32 mmol/L   Calcium 9.5 8.6 - 10.4 mg/dL   Total Protein 6.5 6.1 - 8.1 g/dL   Albumin 4.4 3.6 - 5.1 g/dL   Globulin 2.1 1.9 - 3.7 g/dL (calc)   AG Ratio 2.1 1.0 - 2.5 (calc)   Total Bilirubin 0.4 0.2 - 1.2 mg/dL   Alkaline phosphatase (APISO) 78 33 - 130 U/L   AST 21 10 - 35 U/L   ALT 20 6 - 29 U/L      Assessment & Plan:   Problem List Items Addressed This Visit      Cardiovascular and Mediastinum   Hypertension - Primary (Chronic)    Well-controlled; discussed DASH guidelines; sodium reduction; continue medicine      Relevant Medications   lovastatin (MEVACOR) 20 MG tablet  Musculoskeletal and Integument    Osteopenia (Chronic)    Next due Oct 2020; fall reduction and weight-bearing exercise discussed        Other   Vitamin D deficiency disease    Check level, important for bone health      Relevant Orders   VITAMIN D 25 Hydroxy (Vit-D Deficiency, Fractures)   Medication monitoring encounter    Check -lytes, Cr, SGPT      Relevant Orders   COMPLETE METABOLIC PANEL WITH GFR   Hyperlipidemia (Chronic)    Reviewed the $4 list from [facility] and patient woulud like to switch to less expensive medicine when the lovastatin runs out in Februay; simvastatin is on the list and we can change to that when needed; new Rx will go out in February when patient calls; she is not on amlodipine so okay to switch      Relevant Medications   lovastatin (MEVACOR) 20 MG tablet   Other Relevant Orders   Lipid panel    Other Visit Diagnoses    Screening for breast cancer       Relevant Orders   MM 3D SCREEN BREAST BILATERAL       Follow up plan: Return in about 6 months (around 01/03/2019) for follow-up visit with Dr. Sanda Klein.  An after-visit summary was printed and given to the patient at [facility].  Please see the patient instructions which may contain other information and recommendations beyond what is mentioned above in the assessment and plan.  Meds ordered this encounter  Medications  . lovastatin (MEVACOR) 20 MG tablet    Sig: Take 1 tablet (20 mg total) by mouth at bedtime.    Orders Placed This Encounter  Procedures  . MM 3D SCREEN BREAST BILATERAL  . COMPLETE METABOLIC PANEL WITH GFR  . Lipid panel  . VITAMIN D 25 Hydroxy (Vit-D Deficiency, Fractures)

## 2018-07-05 NOTE — Assessment & Plan Note (Signed)
Check level, important for bone health

## 2018-07-05 NOTE — Assessment & Plan Note (Signed)
Reviewed the $4 list from [place] and patient woulud like to switch to less expensive medicine when the lovastatin runs out in [place]; simvastatin is on the list and we can change to that when needed; new Rx will go out in February when patient calls; she is not on amlodipine so okay to switch

## 2018-07-06 LAB — COMPLETE METABOLIC PANEL WITH GFR
AG RATIO: 2.3 (calc) (ref 1.0–2.5)
ALBUMIN MSPROF: 4.6 g/dL (ref 3.6–5.1)
ALKALINE PHOSPHATASE (APISO): 82 U/L (ref 33–130)
ALT: 17 U/L (ref 6–29)
AST: 21 U/L (ref 10–35)
BUN: 20 mg/dL (ref 7–25)
CHLORIDE: 102 mmol/L (ref 98–110)
CO2: 28 mmol/L (ref 20–32)
Calcium: 9.5 mg/dL (ref 8.6–10.4)
Creat: 0.85 mg/dL (ref 0.50–0.99)
GFR, EST AFRICAN AMERICAN: 82 mL/min/{1.73_m2} (ref >=60)
GFR, Est Non African American: 71 mL/min/{1.73_m2} (ref >=60)
GLOBULIN: 2 g/dL (ref 1.9–3.7)
Glucose, Bld: 86 mg/dL (ref 65–99)
Potassium: 3.8 mmol/L (ref 3.5–5.3)
SODIUM: 140 mmol/L (ref 135–146)
Total Bilirubin: 0.5 mg/dL (ref 0.2–1.2)
Total Protein: 6.6 g/dL (ref 6.1–8.1)

## 2018-07-06 LAB — VITAMIN D 25 HYDROXY (VIT D DEFICIENCY, FRACTURES): VIT D 25 HYDROXY: 30 ng/mL (ref 30–100)

## 2018-07-06 LAB — LIPID PANEL
CHOLESTEROL: 183 mg/dL (ref <200)
HDL: 80 mg/dL (ref >50)
LDL Cholesterol (Calc): 80 mg/dL (calc)
Non-HDL Cholesterol (Calc): 103 mg/dL (calc) (ref <130)
Total CHOL/HDL Ratio: 2.3 (calc) (ref <5.0)
Triglycerides: 135 mg/dL (ref <150)

## 2018-07-06 NOTE — Progress Notes (Signed)
Joelene Millin, please let the patient know that her glucose is normal; kidney function stable; avoid NSAIDs and stay well-hydrated; liver enzymes are normal; we'll see if the change in her cholesterol medicine in a few weeks will help bring her LDL down even further; her TG improved and her HDL is great; please ask her to get fasting lipid panel about 6 weeks after she makes the change to the other cholesterol medicine (please ORDER, dx hyperlipidemia); her vitamin D is at the very bottom of the normal range, so take an extra 1,000 iu of vitamin D3 daily for the next 3-4 months, then just take 1,000 iu daily or get more sun exposure  The 10-year ASCVD risk score [name], et al., 2013) is: 6.7%   Values used to calculate the score:     Age: 67 years     Sex: Female     Is Non-Hispanic African American: No     Diabetic: No     Tobacco smoker: No     Systolic Blood Pressure: 014 mmHg     Is BP treated: Yes     HDL Cholesterol: 80 mg/dL     Total Cholesterol: 183 mg/dL

## 2018-07-08 ENCOUNTER — Telehealth: Payer: Self-pay

## 2018-07-08 DIAGNOSIS — E785 Hyperlipidemia, unspecified: Secondary | ICD-10-CM

## 2018-07-08 NOTE — Telephone Encounter (Signed)
-----   Message from Arnetha Courser, MD sent at 07/06/2018  4:45 PM EST ----- Joelene Millin, please let the patient know that her glucose is normal; kidney function stable; avoid NSAIDs and stay well-hydrated; liver enzymes are normal; we'll see if the change in her cholesterol medicine in a few weeks will help bring her LDL down even further; her TG improved and her HDL is great; please ask her to get fasting lipid panel about 6 weeks after she makes the change to the other cholesterol medicine (please ORDER, dx hyperlipidemia); her vitamin D is at the very bottom of the normal range, so take an extra 1,000 iu of vitamin D3 daily for the next 3-4 months, then just take 1,000 iu daily or get more sun exposure  The 10-year ASCVD risk score [name], et al., 2013) is: 6.7%   Values used to calculate the score:     Age: 89 years     Sex: Female     Is Non-Hispanic African American: No     Diabetic: No     Tobacco smoker: No     Systolic Blood Pressure: 790 mmHg     Is BP treated: Yes     HDL Cholesterol: 80 mg/dL     Total Cholesterol: 183 mg/dL

## 2018-08-23 DIAGNOSIS — Z1231 Encounter for screening mammogram for malignant neoplasm of breast: Secondary | ICD-10-CM | POA: Diagnosis not present

## 2018-08-23 LAB — HM MAMMOGRAPHY

## 2018-08-30 ENCOUNTER — Encounter: Payer: Medicare HMO | Admitting: Family Medicine

## 2018-08-30 ENCOUNTER — Ambulatory Visit (INDEPENDENT_AMBULATORY_CARE_PROVIDER_SITE_OTHER): Payer: Medicare HMO

## 2018-08-30 VITALS — BP 120/82 | HR 69 | Temp 98.0°F | Resp 16 | Ht 62.0 in | Wt 140.8 lb

## 2018-08-30 DIAGNOSIS — Z0000 Encounter for general adult medical examination without abnormal findings: Secondary | ICD-10-CM

## 2018-08-30 DIAGNOSIS — M7070 Other bursitis of hip, unspecified hip: Secondary | ICD-10-CM | POA: Insufficient documentation

## 2018-08-30 DIAGNOSIS — E785 Hyperlipidemia, unspecified: Secondary | ICD-10-CM | POA: Diagnosis not present

## 2018-08-30 HISTORY — DX: Other bursitis of hip, unspecified hip: M70.70

## 2018-08-30 LAB — LIPID PANEL
CHOL/HDL RATIO: 2.1 (calc) (ref <5.0)
Cholesterol: 175 mg/dL (ref <200)
HDL: 83 mg/dL (ref >50)
LDL CHOLESTEROL (CALC): 70 mg/dL
Non-HDL Cholesterol (Calc): 92 mg/dL (calc) (ref <130)
TRIGLYCERIDES: 135 mg/dL (ref <150)

## 2018-08-30 NOTE — Progress Notes (Signed)
  Patient ID: Carmen Clarke, female   DOB: 05/09/1951, 68 y.o.   MRN: [id number]   Subjective:   Carmen Clarke is a 68 y.o. female here for a complete physical exam  Interim issues since last visit: does not want to address her left sided breast pain or cholesterol issues at all when she found out that those issues were not part of the preventive care visit; she became a little flustered and decided she didn't want to be seen at all and would just reschedule; visit was stopped, office manager scheduled her a new visit    Past Medical History:  Diagnosis Date  . Fracture of patella, left, closed 03/28/2017   2018  . Hyperlipidemia   . Hypertension   . Menopausal symptoms    previous HRT  . Osteopenia   . Vitamin D deficiency disease    Past Surgical History:  Procedure Laterality Date  . COLONOSCOPY  2014  . FEMUR FRACTURE SURGERY    . FOOT SURGERY  Aug. 2016  . HIP FRACTURE SURGERY    . knee arthoscopy Left    partial medial and lateral meniscetomy with removal of deep hardware from left distal femur   Family History  Problem Relation Age of Onset  . Diabetes Mother   . Hypertension Mother   . Hypertension Father   . Cancer Neg Hx   . COPD Neg Hx   . Heart disease Neg Hx   . Stroke Neg Hx    Social History   Tobacco Use  . Smoking status: Never Smoker  . Smokeless tobacco: Never Used  Substance Use Topics  . Alcohol use: No    Alcohol/week: 0.0 standard drinks  . Drug use: No   Review of Systems  Objective:   Vitals:   08/30/18 1012  BP: 120/82  Pulse: 69  Temp: 98 F (36.7 C)  TempSrc: Oral  SpO2: 99%  Weight: 140 lb 12.8 oz (63.9 kg)  Height: 5\' 2"  (1.575 m)   Body mass index is 25.75 kg/m. Wt Readings from Last 3 Encounters:  08/30/18 140 lb 12.8 oz (63.9 kg)  08/30/18 140 lb 12.8 oz (63.9 kg)  07/05/18 141 lb (64 kg)   Physical Exam  Assessment/Plan:   Problem List Items Addressed This Visit    None       No orders of the  defined types were placed in this encounter.  No orders of the defined types were placed in this encounter.   Follow up plan: No follow-ups on file.  An After Visit Summary was printed and given to the patient.

## 2018-08-30 NOTE — Progress Notes (Signed)
Subjective:   KEANU FRICKEY is a 68 y.o. female who presents for Medicare Annual (Subsequent) preventive examination.  Review of Systems:   Cardiac Risk Factors include: advanced age (>58men, >67 women);dyslipidemia;hypertension     Objective:     Vitals: BP 120/82 (BP Location: Left Arm, Patient Position: Sitting, Cuff Size: Normal)   Pulse 69   Temp 98 F (36.7 C) (Oral)   Resp 16   Ht 5\' 2"  (1.575 m)   Wt 140 lb 12.8 oz (63.9 kg)   SpO2 99%   BMI 25.75 kg/m   Body mass index is 25.75 kg/m.  Advanced Directives 08/30/2018 06/30/2016  Does Patient Have a Medical Advance Directive? Yes Yes  Type of Paramedic of [place];Living will Living will;Healthcare Power of [place] in Chart? Yes - validated most recent copy scanned in chart (See row information) -    Tobacco Social History   Tobacco Use  Smoking Status Never Smoker  Smokeless Tobacco Never Used     Counseling given: Not Answered   Clinical Intake:  Pre-visit preparation completed: Yes  Pain : No/denies pain     Nutritional Status: BMI 25 -29 Overweight Nutritional Risks: None Diabetes: No  How often do you need to have someone help you when you read instructions, pamphlets, or other written materials from your doctor or pharmacy?: 1 - Never What is the last grade level you completed in school?: high school graduate  Interpreter Needed?: No  Information entered by :: Clemetine Marker LPN  Past Medical History:  Diagnosis Date  . Fracture of patella, left, closed 03/28/2017   2018  . Hyperlipidemia   . Hypertension   . Menopausal symptoms    previous HRT  . Osteopenia   . Vitamin D deficiency disease    Past Surgical History:  Procedure Laterality Date  . COLONOSCOPY  2014  . FEMUR FRACTURE SURGERY    . FOOT SURGERY  Aug. 2016  . HIP FRACTURE SURGERY    . knee arthoscopy Left    partial medial and lateral meniscetomy with  removal of deep hardware from left distal femur   Family History  Problem Relation Age of Onset  . Diabetes Mother   . Hypertension Mother   . Hypertension Father   . Cancer Neg Hx   . COPD Neg Hx   . Heart disease Neg Hx   . Stroke Neg Hx    Social History   Socioeconomic History  . Marital status: Married    Spouse name: Not on file  . Number of children: 1  . Years of education: Not on file  . Highest education level: High school graduate  Occupational History  . Occupation: retired  Scientific laboratory technician  . Financial resource strain: Not hard at all  . Food insecurity:    Worry: Never true    Inability: Never true  . Transportation needs:    Medical: No    Non-medical: No  Tobacco Use  . Smoking status: Never Smoker  . Smokeless tobacco: Never Used  Substance and Sexual Activity  . Alcohol use: No    Alcohol/week: 0.0 standard drinks  . Drug use: No  . Sexual activity: Yes  Lifestyle  . Physical activity:    Days per week: 2 days    Minutes per session: 30 min  . Stress: Only a little  Relationships  . Social connections:    Talks on phone: More than three times  a week    Gets together: More than three times a week    Attends religious service: More than 4 times per year    Active member of club or organization: No    Attends meetings of clubs or organizations: Never    Relationship status: Married  Other Topics Concern  . Not on file  Social History Narrative  . Not on file    Outpatient Encounter Medications as of 08/30/2018  Medication Sig  . aspirin EC 81 MG tablet Take 81 mg by mouth daily.  . Calcium Carbonate-Vit D-Min (CALCIUM 1200) 1200-1000 MG-UNIT CHEW Chew 1 capsule by mouth daily.  Marland Kitchen lisinopril-hydrochlorothiazide (PRINZIDE,ZESTORETIC) 20-25 MG tablet Take 1 tablet by mouth daily.  Marland Kitchen lovastatin (MEVACOR) 20 MG tablet Take 1 tablet (20 mg total) by mouth at bedtime.   No facility-administered encounter medications on file as of 08/30/2018.      Activities of Daily Living In your present state of health, do you have any difficulty performing the following activities: 08/30/2018 07/05/2018  Hearing? N N  Comment declines hearing aids -  Vision? N N  Comment wears glasses -  Difficulty concentrating or making decisions? N N  Walking or climbing stairs? N N  Dressing or bathing? N N  Doing errands, shopping? N N  Preparing Food and eating ? N -  Using the Toilet? N -  In the past six months, have you accidently leaked urine? N -  Do you have problems with loss of bowel control? N -  Managing your Medications? N -  Managing your Finances? N -  Housekeeping or managing your Housekeeping? N -  Some recent data might be hidden    Patient Care Team: Lada, Satira Anis, MD as PCP - General (Family Medicine) Anell Barr, OD as Consulting Physician (Optometry) Wilhelmina Mcardle., MD as Consulting Physician (Dentistry) Dasher, Rayvon Char, MD (Dermatology) Lovell Sheehan, MD as Consulting Physician (Orthopedic Surgery)    Assessment:   This is a routine wellness examination for [place].  Exercise Activities and Dietary recommendations Current Exercise Habits: Home exercise routine, Type of exercise: calisthenics;walking, Time (Minutes): 30, Frequency (Times/Week): 2, Weekly Exercise (Minutes/Week): 60, Intensity: Moderate, Exercise limited by: None identified  Goals    . Increase physical activity     Pt would like to increase physical activity to 150 minutes per week     . Prevent falls       Fall Risk Fall Risk  08/30/2018 07/05/2018 01/11/2018 07/13/2017 06/30/2016  Falls in the past year? 0 0 Yes Yes No  Number falls in past yr: 0 0 1 1 -  Injury with Fall? - 0 - Yes -  Follow up Falls prevention discussed - - - -   FALL RISK PREVENTION PERTAINING TO THE HOME:  Any stairs in or around the home WITH handrails? Yes  Home free of loose throw rugs in walkways, pet beds, electrical cords, etc? Yes  Adequate lighting  in your home to reduce risk of falls? Yes   ASSISTIVE DEVICES UTILIZED TO PREVENT FALLS:  Life alert? No  Use of a cane, walker or w/c? No  Grab bars in the bathroom? Yes  Shower chair or bench in shower? No  Elevated toilet seat or a handicapped toilet? Yes   DME ORDERS:  DME order needed?  No   TIMED UP AND GO:  Was the test performed? Yes .  Length of time to ambulate 10 feet: 5 sec.   GAIT:  Appearance of gait: Gait stead-fast and without the use of an assistive device.  Education: Fall risk prevention has been discussed.  Intervention(s) required? No   Depression Screen PHQ 2/9 Scores 08/30/2018 07/05/2018 01/11/2018 07/13/2017  PHQ - 2 Score 0 0 0 0  PHQ- 9 Score 1 0 - -     Cognitive Function     6CIT Screen 08/30/2018  What Year? 0 points  What month? 0 points  What time? 0 points  Count back from 20 0 points  Months in reverse 0 points  Repeat phrase 0 points  Total Score 0    Immunization History  Administered Date(s) Administered  . Influenza, High Dose Seasonal PF 06/30/2016, 06/01/2017, 05/16/2018  . Influenza,inj,Quad PF,6+ Mos 05/14/2015  . Pneumococcal Conjugate-13 09/24/2015  . Pneumococcal Polysaccharide-23 07/13/2017  . Tdap 05/14/2015  . Zoster 06/14/2012  . Zoster Recombinat (Shingrix) 01/17/2018, 04/04/2018    Qualifies for Shingles Vaccine? Shingrix series completed 2019.  Tdap: Up to date  Flu Vaccine: Up to date  Pneumococcal Vaccine: Up to date   Screening Tests Health Maintenance  Topic Date Due  . MAMMOGRAM  07/20/2018  . DEXA SCAN  06/09/2019  . COLONOSCOPY  09/09/2022  . TETANUS/TDAP  05/13/2025  . INFLUENZA VACCINE  Completed  . PNA vac Low Risk Adult  Completed  . Hepatitis C Screening  Addressed    Cancer Screenings:  Colorectal Screening: Completed 09/09/12. Repeat every 10 years  Mammogram: Completed 08/23/18 at [facility]. Pt received normal report, awaiting records to be scanned in to our office.  Repeat every year;   Bone Density: Completed 06/08/17. Results reflect OSTEOPENIA Repeat every 2 years.   Lung Cancer Screening: (Low Dose CT Chest recommended if Age 18-80 years, 30 pack-year currently smoking OR have quit w/in 15years.) does not qualify.    Additional Screening:  Hepatitis C Screening: does qualify; Completed 12/18/14  Vision Screening: Recommended annual ophthalmology exams for early detection of glaucoma and other disorders of the eye. Is the patient up to date with their annual eye exam?  Yes  Who is the provider or what is the name of the office in which the pt attends annual eye exams? Dr. Ellin Mayhew  Dental Screening: Recommended annual dental exams for proper oral hygiene  Community Resource Referral:  CRR required this visit?  No      Plan:    I have personally reviewed and addressed the Medicare Annual Wellness questionnaire and have noted the following in the patient's chart:  A. Medical and social history B. Use of alcohol, tobacco or illicit drugs  C. Current medications and supplements D. Functional ability and status E.  Nutritional status F.  Physical activity G. Advance directives H. List of other physicians I.  Hospitalizations, surgeries, and ER visits in previous 12 months J.  [place] such as hearing and vision if needed, cognitive and depression L. Referrals and appointments   In addition, I have reviewed and discussed with patient certain preventive protocols, quality metrics, and best practice recommendations. A written personalized care plan for preventive services as well as general preventive health recommendations were provided to patient.   Signed,  Clemetine Marker, LPN Nurse Health Advisor   Nurse Notes: pt doing well and appreciative of visit today. She is interested in changing lovastatin to simvastatin to lower cost and get on the [facility] generic list. Pt seeing Dr. Sanda Klein today.

## 2018-08-30 NOTE — Patient Instructions (Signed)
Carmen Clarke , Thank you for taking time to come for your Medicare Wellness Visit. I appreciate your ongoing commitment to your health goals. Please review the following plan we discussed and let me know if I can assist you in the future.   Screening recommendations/referrals: Colonoscopy: done 09/09/12 repeat in 2024 Mammogram: done 08/23/18 repeat every year Bone Density: done 06/08/17 due after 06/09/19 Recommended yearly ophthalmology/optometry visit for glaucoma screening and checkup Recommended yearly dental visit for hygiene and checkup  Vaccinations: Influenza vaccine: done 05/16/18 Pneumococcal vaccine: done 07/13/17 Tdap vaccine: done 05/14/15 Shingles vaccine: Shingrix series completed 2019    Conditions/risks identified: Recommend increasing physical activity to 150 minutes per week  Next appointment: Please follow up in one year for your Medicare Annual Wellness visit.     Preventive Care 68 Years and Older, Female Preventive care refers to lifestyle choices and visits with your health care provider that can promote health and wellness. What does preventive care include?  A yearly physical exam. This is also called an annual well check.  Dental exams once or twice a year.  Routine eye exams. Ask your health care provider how often you should have your eyes checked.  Personal lifestyle choices, including:  Daily care of your teeth and gums.  Regular physical activity.  Eating a healthy diet.  Avoiding tobacco and drug use.  Limiting alcohol use.  Practicing safe sex.  Taking low-dose aspirin every day.  Taking vitamin and mineral supplements as recommended by your health care provider. What happens during an annual well check? The services and screenings done by your health care provider during your annual well check will depend on your age, overall health, lifestyle risk factors, and family history of disease. Counseling  Your health care provider may ask  you questions about your:  Alcohol use.  Tobacco use.  Drug use.  Emotional well-being.  Home and relationship well-being.  Sexual activity.  Eating habits.  History of falls.  Memory and ability to understand (cognition).  Work and work Statistician.  Reproductive health. Screening  You may have the following tests or measurements:  Height, weight, and BMI.  Blood pressure.  Lipid and cholesterol levels. These may be checked every 5 years, or more frequently if you are over 23 years old.  Skin check.  Lung cancer screening. You may have this screening every year starting at age 68 if you have a 30-pack-year history of smoking and currently smoke or have quit within the past 15 years.  Fecal occult blood test (FOBT) of the stool. You may have this test every year starting at age 68.  Flexible sigmoidoscopy or colonoscopy. You may have a sigmoidoscopy every 5 years or a colonoscopy every 10 years starting at age 68.  Hepatitis C blood test.  Hepatitis B blood test.  Sexually transmitted disease (STD) testing.  Diabetes screening. This is done by checking your blood sugar (glucose) after you have not eaten for a while (fasting). You may have this done every 1-3 years.  Bone density scan. This is done to screen for osteoporosis. You may have this done starting at age 68.  Mammogram. This may be done every 1-2 years. Talk to your health care provider about how often you should have regular mammograms. Talk with your health care provider about your test results, treatment options, and if necessary, the need for more tests. Vaccines  Your health care provider may recommend certain vaccines, such as:  Influenza vaccine. This is recommended every year.  Tetanus, diphtheria, and acellular pertussis (Tdap, Td) vaccine. You may need a Td booster every 10 years.  Zoster vaccine. You may need this after age 68.  Pneumococcal 13-valent conjugate (PCV13) vaccine. One dose  is recommended after age 68.  Pneumococcal polysaccharide (PPSV23) vaccine. One dose is recommended after age 68. Talk to your health care provider about which screenings and vaccines you need and how often you need them. This information is not intended to replace advice given to you by your health care provider. Make sure you discuss any questions you have with your health care provider. Document Released: 08/27/2015 Document Revised: 04/19/2016 Document Reviewed: 06/01/2015 Elsevier Interactive Patient Education  2017 [place] Prevention in the Home Falls can cause injuries. They can happen to people of all ages. There are many things you can do to make your home safe and to help prevent falls. What can I do on the outside of my home?  Regularly fix the edges of walkways and driveways and fix any cracks.  Remove anything that might make you trip as you walk through a door, such as a raised step or threshold.  Trim any bushes or trees on the path to your home.  Use bright outdoor lighting.  Clear any walking paths of anything that might make someone trip, such as rocks or tools.  Regularly check to see if handrails are loose or broken. Make sure that both sides of any steps have handrails.  Any raised decks and porches should have guardrails on the edges.  Have any leaves, snow, or ice cleared regularly.  Use sand or salt on walking paths during winter.  Clean up any spills in your garage right away. This includes oil or grease spills. What can I do in the bathroom?  Use night lights.  Install grab bars by the toilet and in the tub and shower. Do not use towel bars as grab bars.  Use non-skid mats or decals in the tub or shower.  If you need to sit down in the shower, use a plastic, non-slip stool.  Keep the floor dry. Clean up any water that spills on the floor as soon as it happens.  Remove soap buildup in the tub or shower regularly.  Attach bath mats  securely with double-sided non-slip rug tape.  Do not have throw rugs and other things on the floor that can make you trip. What can I do in the bedroom?  Use night lights.  Make sure that you have a light by your bed that is easy to reach.  Do not use any sheets or blankets that are too big for your bed. They should not hang down onto the floor.  Have a firm chair that has side arms. You can use this for support while you get dressed.  Do not have throw rugs and other things on the floor that can make you trip. What can I do in the kitchen?  Clean up any spills right away.  Avoid walking on wet floors.  Keep items that you use a lot in easy-to-reach places.  If you need to reach something above you, use a strong step stool that has a grab bar.  Keep electrical cords out of the way.  Do not use floor polish or wax that makes floors slippery. If you must use wax, use non-skid floor wax.  Do not have throw rugs and other things on the floor that can make you trip. What can I do  with my stairs?  Do not leave any items on the stairs.  Make sure that there are handrails on both sides of the stairs and use them. Fix handrails that are broken or loose. Make sure that handrails are as long as the stairways.  Check any carpeting to make sure that it is firmly attached to the stairs. Fix any carpet that is loose or worn.  Avoid having throw rugs at the top or bottom of the stairs. If you do have throw rugs, attach them to the floor with carpet tape.  Make sure that you have a light switch at the top of the stairs and the bottom of the stairs. If you do not have them, ask someone to add them for you. What else can I do to help prevent falls?  Wear shoes that:  Do not have high heels.  Have rubber bottoms.  Are comfortable and fit you well.  Are closed at the toe. Do not wear sandals.  If you use a stepladder:  Make sure that it is fully opened. Do not climb a closed  stepladder.  Make sure that both sides of the stepladder are locked into place.  Ask someone to hold it for you, if possible.  Clearly mark and make sure that you can see:  Any grab bars or handrails.  First and last steps.  Where the edge of each step is.  Use tools that help you move around (mobility aids) if they are needed. These include:  Canes.  Walkers.  Scooters.  Crutches.  Turn on the lights when you go into a dark area. Replace any light bulbs as soon as they burn out.  Set up your furniture so you have a clear path. Avoid moving your furniture around.  If any of your floors are uneven, fix them.  If there are any pets around you, be aware of where they are.  Review your medicines with your doctor. Some medicines can make you feel dizzy. This can increase your chance of falling. Ask your doctor what other things that you can do to help prevent falls. This information is not intended to replace advice given to you by your health care provider. Make sure you discuss any questions you have with your health care provider. Document Released: 05/27/2009 Document Revised: 01/06/2016 Document Reviewed: 09/04/2014 Elsevier Interactive Patient Education  2017 [employer].

## 2018-09-02 ENCOUNTER — Telehealth: Payer: Self-pay

## 2018-09-02 ENCOUNTER — Other Ambulatory Visit: Payer: Self-pay | Admitting: Family Medicine

## 2018-09-02 DIAGNOSIS — E785 Hyperlipidemia, unspecified: Secondary | ICD-10-CM

## 2018-09-02 MED ORDER — SIMVASTATIN 20 MG PO TABS: 20.0000 mg | ORAL_TABLET | Freq: Every day | ORAL | 0 refills | Status: DC

## 2018-09-02 NOTE — Progress Notes (Signed)
Switch statin, recheck lipids in 6-8 weeks

## 2018-09-02 NOTE — Telephone Encounter (Signed)
-----   Message from Arnetha Courser, MD sent at 09/02/2018  3:22 PM EST ----- Carmen Clarke, please let the patient know that her total cholesterol has come down 8 points, and her LDL cholesterol has dome down 10 points. I know she wanted to change from lovastatin to simvastatin because of the cost. Let's start simvastatin 20 mg every night and then reassess her lipids after 6-8 weeks (please ORDER). Try to limit saturated fats too. Thank you  The 10-year ASCVD risk score [name], et al., 2013) is: 7.6%   Values used to calculate the score:     Age: 68 years     Sex: Female     Is Non-Hispanic African American: No     Diabetic: No     Tobacco smoker: No     Systolic Blood Pressure: 867 mmHg     Is BP treated: Yes     HDL Cholesterol: 83 mg/dL     Total Cholesterol: 175 mg/dL

## 2018-09-20 DIAGNOSIS — J209 Acute bronchitis, unspecified: Secondary | ICD-10-CM | POA: Diagnosis not present

## 2018-09-20 DIAGNOSIS — J069 Acute upper respiratory infection, unspecified: Secondary | ICD-10-CM | POA: Diagnosis not present

## 2018-12-30 ENCOUNTER — Other Ambulatory Visit: Payer: Self-pay | Admitting: Family Medicine

## 2019-01-17 ENCOUNTER — Other Ambulatory Visit: Payer: Self-pay

## 2019-01-17 ENCOUNTER — Ambulatory Visit: Payer: Medicare HMO | Admitting: Family Medicine

## 2019-01-17 ENCOUNTER — Ambulatory Visit (INDEPENDENT_AMBULATORY_CARE_PROVIDER_SITE_OTHER): Payer: Medicare HMO | Admitting: Family Medicine

## 2019-01-17 ENCOUNTER — Encounter: Payer: Self-pay | Admitting: Family Medicine

## 2019-01-17 VITALS — BP 124/86 | HR 70 | Temp 98.4°F | Resp 14 | Ht 62.0 in | Wt 137.8 lb

## 2019-01-17 DIAGNOSIS — I10 Essential (primary) hypertension: Secondary | ICD-10-CM

## 2019-01-17 DIAGNOSIS — Z1239 Encounter for other screening for malignant neoplasm of breast: Secondary | ICD-10-CM | POA: Diagnosis not present

## 2019-01-17 DIAGNOSIS — M8588 Other specified disorders of bone density and structure, other site: Secondary | ICD-10-CM

## 2019-01-17 DIAGNOSIS — E782 Mixed hyperlipidemia: Secondary | ICD-10-CM | POA: Diagnosis not present

## 2019-01-17 DIAGNOSIS — E559 Vitamin D deficiency, unspecified: Secondary | ICD-10-CM | POA: Diagnosis not present

## 2019-01-17 MED ORDER — LISINOPRIL-HYDROCHLOROTHIAZIDE 20-25 MG PO TABS: 1.0000 | ORAL_TABLET | Freq: Every day | ORAL | 3 refills | Status: DC

## 2019-01-17 MED ORDER — SIMVASTATIN 20 MG PO TABS: 20.0000 mg | ORAL_TABLET | Freq: Every day | ORAL | 3 refills | Status: DC

## 2019-01-17 NOTE — Progress Notes (Signed)
Name: Carmen Clarke   MRN: [id number]    DOB: 1950-08-26   Date:01/17/2019       Progress Note  Subjective  Chief Complaint  Chief Complaint  Patient presents with   Follow-up   Medication Refill    HPI  PT presents to follow up, she is new to me today.   HTN:  -does take medications as prescribed - current regimen includes Lisinopril-HCTZ 20-25.  - taking medications as instructed, no medication side effects noted, no TIAs, no chest pain on exertion, no dyspnea on exertion, no swelling of ankles, no palpitations - DASH diet discussed - pt does follow a low sodium diet; salt not added to cooking and salt shaker not on table  Hyperlipidemia: Current Medication Regimen: Simvastatin 20mg . Last Lipids: Due for repeat Lab Results  Component Value Date   CHOL 175 08/30/2018   HDL 83 08/30/2018   LDLCALC 70 08/30/2018   LDLDIRECT 74 04/14/2016   TRIG 135 08/30/2018   CHOLHDL 2.1 08/30/2018  - Current Diet: - Denies: Chest pain, shortness of breath, myalgias. - Documented aortic atherosclerosis? No - Risk factors for atherosclerosis: hypercholesterolemia and hypertension  Vitamin D Deficiency/Osteopenia: Taking Vitamin D supplement and calcium.  Due for DEXA scan in November 2020; no recent fractures, no fatigue.  Patient Active Problem List   Diagnosis Date Noted   Bursitis of hip 08/30/2018   Full code status 01/11/2018   Fracture of patella, left, closed 03/28/2017   Medication monitoring encounter 11/09/2015   Arm pain, left 09/28/2015   Shoulder pain, left 09/28/2015   Preventative health care 05/18/2015   Overweight (BMI 25.0-29.9) 05/14/2015   Breast cancer screening 05/14/2015   Hypertension    Hyperlipidemia    Osteopenia    Vitamin D deficiency disease     Past Surgical History:  Procedure Laterality Date   COLONOSCOPY  2014   FEMUR FRACTURE SURGERY     FOOT SURGERY  Aug. 2016   HIP FRACTURE SURGERY     knee arthoscopy Left    partial medial and lateral meniscetomy with removal of deep hardware from left distal femur    Family History  Problem Relation Age of Onset   Diabetes Mother    Hypertension Mother    Hypertension Father    Cancer Neg Hx    COPD Neg Hx    Heart disease Neg Hx    Stroke Neg Hx     Social History   Socioeconomic History   Marital status: Married    Spouse name: Not on file   Number of children: 1   Years of education: Not on file   Highest education level: High school graduate  Occupational History   Occupation: retired  Scientist, product/process development strain: Not hard at [employer] insecurity:    Worry: Never true    Inability: Never true   Transportation needs:    Medical: No    Non-medical: No  Tobacco Use   Smoking status: Never Smoker   Smokeless tobacco: Never Used  Substance and Sexual Activity   Alcohol use: No    Alcohol/week: 0.0 standard drinks   Drug use: No   Sexual activity: Yes  Lifestyle   Physical activity:    Days per week: 2 days    Minutes per session: 30 min   Stress: Only a little  Relationships   Social connections:    Talks on phone: More than three times a week  Gets together: More than three times a week    Attends religious service: More than 4 times per year    Active member of club or organization: No    Attends meetings of clubs or organizations: Never    Relationship status: Married   Intimate partner violence:    Fear of current or ex partner: No    Emotionally abused: No    Physically abused: No    Forced sexual activity: No  Other Topics Concern   Not on file  Social History Narrative   Not on file     Current Outpatient Medications:    aspirin EC 81 MG tablet, Take 81 mg by mouth daily., Disp: , Rfl:    Calcium Carbonate-Vit D-Min (CALCIUM 1200) 1200-1000 MG-UNIT CHEW, Chew 1 capsule by mouth daily., Disp: , Rfl:    lisinopril-hydrochlorothiazide (PRINZIDE,ZESTORETIC) 20-25 MG  tablet, Take 1 tablet by mouth daily., Disp: 90 tablet, Rfl: 3   simvastatin (ZOCOR) 20 MG tablet, TAKE 1 TABLET BY MOUTH AT BEDTIME, Disp: 90 tablet, Rfl: 0  No Known Allergies  I personally reviewed active problem list, medication list, allergies, notes from last encounter, lab results with the patient/caregiver today.   ROS Constitutional: Negative for fever or weight change.  Respiratory: Negative for cough and shortness of breath.   Cardiovascular: Negative for chest pain or palpitations.  Gastrointestinal: Negative for abdominal pain, no bowel changes.  Musculoskeletal: Negative for gait problem or joint swelling.  Skin: Negative for rash.  Neurological: Negative for dizziness or headache.  No other specific complaints in a complete review of systems (except as listed in HPI above).  Objective  Vitals:   01/17/19 1012  BP: 124/86  Pulse: 70  Resp: 14  Temp: 98.4 F (36.9 C)  TempSrc: Oral  SpO2: 95%  Weight: 137 lb 12.8 oz (62.5 kg)  Height: 5\' 2"  (1.575 m)   Body mass index is 25.2 kg/m.  Physical Exam Constitutional: Patient appears well-developed and well-nourished. No distress.  HENT: Head: Normocephalic and atraumatic. Eyes: Conjunctivae and EOM are normal. No scleral icterus.  Neck: Normal range of motion. Neck supple. No JVD present.  Cardiovascular: Normal rate, regular rhythm and normal heart sounds.  No murmur heard. No BLE edema. Pulmonary/Chest: Effort normal and breath sounds normal. No respiratory distress. Musculoskeletal: Normal range of motion, no joint effusions. No gross deformities Neurological: Pt is alert and oriented to person, place, and time. No cranial nerve deficit. Coordination, balance, strength, speech and gait are normal.  Skin: Skin is warm and dry. No rash noted. No erythema.  Psychiatric: Patient has a normal mood and affect. behavior is normal. Judgment and thought content normal.  No results found for this or any previous visit  (from the past 72 hour(s)).  PHQ2/9: Depression screen [facility] 2/9 01/17/2019 08/30/2018 07/05/2018 01/11/2018 07/13/2017  Decreased Interest 0 0 0 0 0  Down, Depressed, Hopeless 0 0 0 0 0  PHQ - 2 Score 0 0 0 0 0  Altered sleeping - 1 0 - -  Tired, decreased energy 0 0 0 - -  Change in appetite 0 0 0 - -  Feeling bad or failure about yourself  0 0 0 - -  Trouble concentrating 0 0 0 - -  Moving slowly or fidgety/restless 0 0 0 - -  Suicidal thoughts 0 - 0 - -  PHQ-9 Score - 1 0 - -  Difficult doing work/chores Not difficult at all - Not difficult at all - -  PHQ-2/9 Result is negative.    Fall Risk: Fall Risk  01/17/2019 08/30/2018 07/05/2018 01/11/2018 07/13/2017  Falls in the past year? 1 0 0 Yes Yes  Number falls in past yr: 0 0 0 1 1  Injury with Fall? 1 - 0 - Yes  Follow up - Falls prevention discussed - - -   Functional Status Survey: Is the patient deaf or have difficulty hearing?: No Does the patient have difficulty seeing, even when wearing glasses/contacts?: No Does the patient have difficulty concentrating, remembering, or making decisions?: No Does the patient have difficulty walking or climbing stairs?: No Does the patient have difficulty dressing or bathing?: No Does the patient have difficulty doing errands alone such as visiting a doctor's office or shopping?: No  Assessment & Plan  1. Essential hypertension - DASH diet - lisinopril-hydrochlorothiazide (ZESTORETIC) 20-25 MG tablet; Take 1 tablet by mouth daily.  Dispense: 90 tablet; Refill: 3  2. Mixed hyperlipidemia - simvastatin (ZOCOR) 20 MG tablet; Take 1 tablet (20 mg total) by mouth at bedtime.  Dispense: 90 tablet; Refill: 3 - Lipid panel  3. Vitamin D deficiency disease - Continue supplementation and due for DEXA at follow up appointment.  4. Osteopenia of spine - Continue supplementation and due for DEXA at follow up appointment.

## 2019-01-17 NOTE — Addendum Note (Signed)
Addended by: Raelyn Ensign E on: 01/17/2019 11:02 AM   Modules accepted: Orders

## 2019-01-18 LAB — LIPID PANEL
Cholesterol: 168 mg/dL (ref <200)
HDL: 83 mg/dL (ref >=50)
LDL Cholesterol (Calc): 62 mg/dL (calc)
Non-HDL Cholesterol (Calc): 85 mg/dL (calc) (ref <130)
Total CHOL/HDL Ratio: 2 (calc) (ref <5.0)
Triglycerides: 142 mg/dL (ref <150)

## 2019-03-14 DIAGNOSIS — D485 Neoplasm of uncertain behavior of skin: Secondary | ICD-10-CM | POA: Diagnosis not present

## 2019-03-14 DIAGNOSIS — D2272 Melanocytic nevi of left lower limb, including hip: Secondary | ICD-10-CM | POA: Diagnosis not present

## 2019-03-14 DIAGNOSIS — L298 Other pruritus: Secondary | ICD-10-CM | POA: Diagnosis not present

## 2019-03-14 DIAGNOSIS — L538 Other specified erythematous conditions: Secondary | ICD-10-CM | POA: Diagnosis not present

## 2019-03-14 DIAGNOSIS — L820 Inflamed seborrheic keratosis: Secondary | ICD-10-CM | POA: Diagnosis not present

## 2019-03-14 DIAGNOSIS — D2262 Melanocytic nevi of left upper limb, including shoulder: Secondary | ICD-10-CM | POA: Diagnosis not present

## 2019-03-14 DIAGNOSIS — X32XXXA Exposure to sunlight, initial encounter: Secondary | ICD-10-CM | POA: Diagnosis not present

## 2019-03-14 DIAGNOSIS — D2261 Melanocytic nevi of right upper limb, including shoulder: Secondary | ICD-10-CM | POA: Diagnosis not present

## 2019-03-14 DIAGNOSIS — L570 Actinic keratosis: Secondary | ICD-10-CM | POA: Diagnosis not present

## 2019-03-14 DIAGNOSIS — Z85828 Personal history of other malignant neoplasm of skin: Secondary | ICD-10-CM | POA: Diagnosis not present

## 2019-03-14 DIAGNOSIS — D0462 Carcinoma in situ of skin of left upper limb, including shoulder: Secondary | ICD-10-CM | POA: Diagnosis not present

## 2019-04-28 DIAGNOSIS — L570 Actinic keratosis: Secondary | ICD-10-CM | POA: Diagnosis not present

## 2019-04-28 DIAGNOSIS — D0462 Carcinoma in situ of skin of left upper limb, including shoulder: Secondary | ICD-10-CM | POA: Diagnosis not present

## 2019-05-28 DIAGNOSIS — R69 Illness, unspecified: Secondary | ICD-10-CM | POA: Diagnosis not present

## 2019-06-13 DIAGNOSIS — Z780 Asymptomatic menopausal state: Secondary | ICD-10-CM | POA: Diagnosis not present

## 2019-06-13 DIAGNOSIS — Z13820 Encounter for screening for osteoporosis: Secondary | ICD-10-CM | POA: Diagnosis not present

## 2019-06-13 DIAGNOSIS — E2839 Other primary ovarian failure: Secondary | ICD-10-CM | POA: Diagnosis not present

## 2019-06-13 LAB — HM DEXA SCAN

## 2019-07-18 ENCOUNTER — Ambulatory Visit (INDEPENDENT_AMBULATORY_CARE_PROVIDER_SITE_OTHER): Payer: Medicare HMO | Admitting: Family Medicine

## 2019-07-18 ENCOUNTER — Other Ambulatory Visit: Payer: Self-pay

## 2019-07-18 ENCOUNTER — Encounter: Payer: Self-pay | Admitting: Family Medicine

## 2019-07-18 VITALS — BP 102/64 | HR 94 | Temp 98.3°F | Resp 14 | Ht 62.0 in | Wt 142.5 lb

## 2019-07-18 DIAGNOSIS — E782 Mixed hyperlipidemia: Secondary | ICD-10-CM

## 2019-07-18 DIAGNOSIS — M25511 Pain in right shoulder: Secondary | ICD-10-CM | POA: Diagnosis not present

## 2019-07-18 DIAGNOSIS — I10 Essential (primary) hypertension: Secondary | ICD-10-CM

## 2019-07-18 MED ORDER — LISINOPRIL-HYDROCHLOROTHIAZIDE 10-12.5 MG PO TABS: 1.0000 | ORAL_TABLET | Freq: Every day | ORAL | 3 refills | Status: DC

## 2019-07-18 NOTE — Patient Instructions (Addendum)
  Carmen Clarke ,  Please review the following plan we discussed and let me know if I can assist you in the future.   This is a list of the screening recommended for you and due dates:  Health Maintenance  Topic Date Due  . Mammogram  08/24/2019  . DEXA scan (bone density measurement)  06/12/2021  . Colon Cancer Screening  09/09/2022  . Tetanus Vaccine  05/13/2025  . Flu Shot  Completed  . Pneumonia vaccines  Completed  .  Hepatitis C: One time screening is recommended by Center for Disease Control  (CDC) for  adults born from [birth year] through 1965.   Addressed    Calorie counting - find an app or website to help you start to look at and track what your calorie intake is with your foods.  There are also calculators that let you know what your body burns a day for your age, weight, height and activity level.  You have to be negative 3500 calories to loose a pound.  Just start looking at what you eat and burn and see if you need to change to get to your goal weight.    But overall you are very healthy and weight is only a little out of normal range.      BP goal with decreasing medicine dose will be to keep you 120/60-140/80,  Let me know if it is too high or too low.

## 2019-07-18 NOTE — Progress Notes (Signed)
Name: Carmen Clarke   MRN: [id number]    DOB: 1950-12-26   Date:07/18/2019       Progress Note  Chief Complaint  Patient presents with  . Annual Exam  . Follow-up  . Hyperlipidemia  . Hypertension     Subjective:   Carmen Clarke is a 68 y.o. female, presents to clinic for routine follow up on the conditions listed above.  Hypertension:  Currently managed on lisinopril-HCTZ Pt reports good med compliance and denies any SE.  No lightheadedness, hypotension, syncope. Blood pressure today is well controlled - low end of normal - could possibly reduce dose  BP Readings from Last 3 Encounters:  07/18/19 102/64  01/17/19 124/86  08/30/18 120/82  Pt denies CP, SOB, exertional sx, LE edema, palpitation, Ha's, visual disturbances Dietary efforts for BP?  Overall healthy and no addition of salt to foods    Hyperlipidemia: Current Medication Regimen:  Simvastatin 20 mg  Last Lipids: Lab Results  Component Value Date   CHOL 168 01/17/2019   HDL 83 01/17/2019   LDLCALC 62 01/17/2019   LDLDIRECT 74 04/14/2016   TRIG 142 01/17/2019   CHOLHDL 2.0 01/17/2019  - Current Diet:  No fried foods - Denies: Chest pain, shortness of breath, myalgias. - Documented aortic atherosclerosis? Yes - Risk factors for atherosclerosis: hypercholesterolemia and hypertension   Right shoulder pain, injury with lifting and helping an a caregiver, she had injury in the past and saw ortho- Dr. Harlow Mares (Marlow Baars?) at [facility], pain has been worse again, she would possibly like to go back again.   Patient Active Problem List   Diagnosis Date Noted  . Bursitis of hip 08/30/2018  . Full code status 01/11/2018  . Fracture of patella, left, closed 03/28/2017  . Medication monitoring encounter 11/09/2015  . Arm pain, left 09/28/2015  . Shoulder pain, left 09/28/2015  . Preventative health care 05/18/2015  . Overweight (BMI 25.0-29.9) 05/14/2015  . Breast cancer screening 05/14/2015  .  Hypertension   . Hyperlipidemia   . Osteopenia   . Vitamin D deficiency disease     Past Surgical History:  Procedure Laterality Date  . COLONOSCOPY  2014  . FEMUR FRACTURE SURGERY    . FOOT SURGERY  Aug. 2016  . HIP FRACTURE SURGERY    . knee arthoscopy Left    partial medial and lateral meniscetomy with removal of deep hardware from left distal femur    Family History  Problem Relation Age of Onset  . Diabetes Mother   . Hypertension Mother   . Hypertension Father   . Cancer Neg Hx   . COPD Neg Hx   . Heart disease Neg Hx   . Stroke Neg Hx     Social History   Socioeconomic History  . Marital status: Married    Spouse name: Not on file  . Number of children: 1  . Years of education: Not on file  . Highest education level: High school graduate  Occupational History  . Occupation: retired  Scientific laboratory technician  . Financial resource strain: Not hard at all  . Food insecurity    Worry: Never true    Inability: Never true  . Transportation needs    Medical: No    Non-medical: No  Tobacco Use  . Smoking status: Never Smoker  . Smokeless tobacco: Never Used  Substance and Sexual Activity  . Alcohol use: No    Alcohol/week: 0.0 standard drinks  . Drug use: No  .  Sexual activity: Yes  Lifestyle  . Physical activity    Days per week: 2 days    Minutes per session: 30 min  . Stress: Only a little  Relationships  . Social connections    Talks on phone: More than three times a week    Gets together: More than three times a week    Attends religious service: More than 4 times per year    Active member of club or organization: No    Attends meetings of clubs or organizations: Never    Relationship status: Married  . Intimate partner violence    Fear of current or ex partner: No    Emotionally abused: No    Physically abused: No    Forced sexual activity: No  Other Topics Concern  . Not on file  Social History Narrative  . Not on file     Current Outpatient  Medications:  .  aspirin EC 81 MG tablet, Take 81 mg by mouth daily., Disp: , Rfl:  .  Calcium Carbonate-Vit D-Min (CALCIUM 1200) 1200-1000 MG-UNIT CHEW, Chew 1 capsule by mouth daily., Disp: , Rfl:  .  lisinopril-hydrochlorothiazide (ZESTORETIC) 20-25 MG tablet, Take 1 tablet by mouth daily., Disp: 90 tablet, Rfl: 3 .  simvastatin (ZOCOR) 20 MG tablet, Take 1 tablet (20 mg total) by mouth at bedtime., Disp: 90 tablet, Rfl: 3  No Known Allergies  I personally reviewed active problem list, medication list, allergies, family history, social history, health maintenance, notes from last encounter, lab results, imaging with the patient/caregiver today.  Review of Systems  Constitutional: Negative.   HENT: Negative.   Eyes: Negative.   Respiratory: Negative.  Negative for cough, choking, chest tightness, shortness of breath, wheezing and stridor.   Cardiovascular: Negative.  Negative for chest pain, palpitations and leg swelling.  Gastrointestinal: Negative.   Endocrine: Negative.   Genitourinary: Negative.   Musculoskeletal: Positive for arthralgias. Negative for back pain, gait problem, joint swelling, myalgias, neck pain and neck stiffness.  Skin: Negative.   Allergic/Immunologic: Negative.   Neurological: Negative.  Negative for dizziness, syncope, weakness, light-headedness and numbness.  Hematological: Negative.   Psychiatric/Behavioral: Negative.   All other systems reviewed and are negative.    Objective:    Vitals:   07/18/19 1024  BP: 102/64  Pulse: 94  Resp: 14  Temp: 98.3 F (36.8 C)  SpO2: 96%  Weight: 142 lb 8 oz (64.6 kg)  Height: 5\' 2"  (1.575 m)    Body mass index is 26.06 kg/m.  Physical Exam Vitals and nursing note reviewed.  Constitutional:      General: She is not in acute distress.    Appearance: Normal appearance. She is well-developed. She is not ill-appearing, toxic-appearing or diaphoretic.     Interventions: Face mask in place.  HENT:     Head:  Normocephalic and atraumatic.     Right Ear: External ear normal.     Left Ear: External ear normal.  Eyes:     General: Lids are normal. No scleral icterus.       Right eye: No discharge.        Left eye: No discharge.     Conjunctiva/sclera: Conjunctivae normal.  Neck:     Trachea: Phonation normal. No tracheal deviation.  Cardiovascular:     Rate and Rhythm: Normal rate and regular rhythm.     Pulses: Normal pulses.          Radial pulses are 2+ on the right side and  2+ on the left side.       Posterior tibial pulses are 2+ on the right side and 2+ on the left side.     Heart sounds: Normal heart sounds. No murmur. No friction rub. No gallop.   Pulmonary:     Effort: Pulmonary effort is normal. No respiratory distress.     Breath sounds: Normal breath sounds. No stridor. No wheezing, rhonchi or rales.  Chest:     Chest wall: No tenderness.  Abdominal:     General: Bowel sounds are normal. There is no distension.     Palpations: Abdomen is soft.     Tenderness: There is no abdominal tenderness. There is no guarding or rebound.  Musculoskeletal:        General: No deformity. Normal range of motion.     Right shoulder: Tenderness present. No swelling, deformity, effusion, bony tenderness or crepitus. Normal range of motion. Normal strength. Normal pulse.     Right upper arm: Normal.     Right elbow: Normal.     Cervical back: Normal range of motion and neck supple.     Right lower leg: No edema.     Left lower leg: No edema.  Lymphadenopathy:     Cervical: No cervical adenopathy.  Skin:    General: Skin is warm and dry.     Capillary Refill: Capillary refill takes less than 2 seconds.     Coloration: Skin is not jaundiced or pale.     Findings: No rash.  Neurological:     Mental Status: She is alert and oriented to person, place, and time.     Motor: No abnormal muscle tone.     Gait: Gait normal.  Psychiatric:        Speech: Speech normal.        Behavior: Behavior  normal.      Recent Results (from the past 2160 hour(s))  HM DEXA SCAN     Status: None   Collection Time: 06/13/19 12:00 AM  Result Value Ref Range   HM Dexa Scan care everywhere    PHQ2/9: Depression screen [facility] 2/9 07/18/2019 01/17/2019 08/30/2018 07/05/2018 01/11/2018  Decreased Interest 0 0 0 0 0  Down, Depressed, Hopeless 0 0 0 0 0  PHQ - 2 Score 0 0 0 0 0  Altered sleeping 0 - 1 0 -  Tired, decreased energy 0 0 0 0 -  Change in appetite 0 0 0 0 -  Feeling bad or failure about yourself  0 0 0 0 -  Trouble concentrating 0 0 0 0 -  Moving slowly or fidgety/restless 0 0 0 0 -  Suicidal thoughts 0 0 - 0 -  PHQ-9 Score 0 - 1 0 -  Difficult doing work/chores Not difficult at all Not difficult at all - Not difficult at all -    phq 9 is negative Reviewed today  Fall Risk: Fall Risk  07/18/2019 01/17/2019 08/30/2018 07/05/2018 01/11/2018  Falls in the past year? 0 1 0 0 Yes  Number falls in past yr: 0 0 0 0 1  Injury with Fall? 0 1 - 0 -  Follow up - - Falls prevention discussed - -      Functional Status Survey: Is the patient deaf or have difficulty hearing?: No Does the patient have difficulty seeing, even when wearing glasses/contacts?: No Does the patient have difficulty concentrating, remembering, or making decisions?: No Does the patient have difficulty walking or climbing stairs?: No Does  the patient have difficulty dressing or bathing?: No Does the patient have difficulty doing errands alone such as visiting a doctor's office or shopping?: No    Assessment & Plan:   1. Essential hypertension BP well controlled but low end of normal, reduce lisinopril-HCTZ dose, monitor at home with BP cuff/homeBP monitoring for the next 2 weeks, pt will send in readings through [place] (going out of town).  If still at goal- CBP ~ 120-140, will continue lower dose. - lisinopril-hydrochlorothiazide (ZESTORETIC) 10-12.5 MG tablet; Take 1 tablet by mouth daily.  Dispense: 90 tablet;  Refill: 3 - CMP w GFR  2. Mixed hyperlipidemia Has been well controlled Compliant with meds, no SE, no myalgias, fatigue or jaundice Due for FLP and recheck CMP - CMP w GFR - Lipid Panel  3. Acute pain of right shoulder Likely a strain - pt is going out of town, will wait on ortho referral - also can contact her orthopedic MD when she gets back in town.  Rest, ice NSAIDs as tolerated - advised PT to help rehab, but pt declines at this time.  She will f/up if not improving  Reviewed HM today - mammogram due next month  Pt wants to work on weight loss, although she is only mildly overweight and appears overall very healthy and proportionate - educated about diet/exercise/calorie deficit for weight reduction, encouraged her to first observe and track her calorie intake and find places that are easy to cut empty calories w/o drastic unsustainable diet changes.     No follow-ups on file.   Delsa Grana, PA-C 07/18/19 10:51 AM

## 2019-07-19 LAB — COMPLETE METABOLIC PANEL WITH GFR
AG Ratio: 2.1 (calc) (ref 1.0–2.5)
ALT: 22 U/L (ref 6–29)
AST: 22 U/L (ref 10–35)
Albumin: 4.7 g/dL (ref 3.6–5.1)
Alkaline phosphatase (APISO): 81 U/L (ref 37–153)
BUN: 25 mg/dL (ref 7–25)
CO2: 31 mmol/L (ref 20–32)
Calcium: 10.7 mg/dL — ABNORMAL HIGH (ref 8.6–10.4)
Chloride: 100 mmol/L (ref 98–110)
Creat: 0.9 mg/dL (ref 0.50–0.99)
GFR, Est African American: 76 mL/min/{1.73_m2} (ref >=60)
GFR, Est Non African American: 66 mL/min/{1.73_m2} (ref >=60)
Globulin: 2.2 g/dL (calc) (ref 1.9–3.7)
Glucose, Bld: 84 mg/dL (ref 65–99)
Potassium: 4.1 mmol/L (ref 3.5–5.3)
Sodium: 140 mmol/L (ref 135–146)
Total Bilirubin: 0.4 mg/dL (ref 0.2–1.2)
Total Protein: 6.9 g/dL (ref 6.1–8.1)

## 2019-07-19 LAB — LIPID PANEL
Cholesterol: 180 mg/dL (ref <200)
HDL: 78 mg/dL (ref >=50)
LDL Cholesterol (Calc): 78 mg/dL (calc)
Non-HDL Cholesterol (Calc): 102 mg/dL (calc) (ref <130)
Total CHOL/HDL Ratio: 2.3 (calc) (ref <5.0)
Triglycerides: 141 mg/dL (ref <150)

## 2019-07-23 DIAGNOSIS — Z7722 Contact with and (suspected) exposure to environmental tobacco smoke (acute) (chronic): Secondary | ICD-10-CM | POA: Diagnosis not present

## 2019-07-23 DIAGNOSIS — Z7982 Long term (current) use of aspirin: Secondary | ICD-10-CM | POA: Diagnosis not present

## 2019-07-23 DIAGNOSIS — Z833 Family history of diabetes mellitus: Secondary | ICD-10-CM | POA: Diagnosis not present

## 2019-07-23 DIAGNOSIS — E785 Hyperlipidemia, unspecified: Secondary | ICD-10-CM | POA: Diagnosis not present

## 2019-07-23 DIAGNOSIS — Z823 Family history of stroke: Secondary | ICD-10-CM | POA: Diagnosis not present

## 2019-07-23 DIAGNOSIS — Z8249 Family history of ischemic heart disease and other diseases of the circulatory system: Secondary | ICD-10-CM | POA: Diagnosis not present

## 2019-07-23 DIAGNOSIS — I10 Essential (primary) hypertension: Secondary | ICD-10-CM | POA: Diagnosis not present

## 2019-07-23 DIAGNOSIS — Z87891 Personal history of nicotine dependence: Secondary | ICD-10-CM | POA: Diagnosis not present

## 2019-07-23 DIAGNOSIS — R69 Illness, unspecified: Secondary | ICD-10-CM | POA: Diagnosis not present

## 2019-07-23 DIAGNOSIS — Z85828 Personal history of other malignant neoplasm of skin: Secondary | ICD-10-CM | POA: Diagnosis not present

## 2019-07-24 ENCOUNTER — Other Ambulatory Visit: Payer: Self-pay

## 2019-07-24 DIAGNOSIS — Z20822 Contact with and (suspected) exposure to covid-19: Secondary | ICD-10-CM

## 2019-07-26 LAB — NOVEL CORONAVIRUS, NAA: SARS-CoV-2, NAA: NOT DETECTED

## 2019-08-27 DIAGNOSIS — L821 Other seborrheic keratosis: Secondary | ICD-10-CM | POA: Diagnosis not present

## 2019-08-27 DIAGNOSIS — X32XXXA Exposure to sunlight, initial encounter: Secondary | ICD-10-CM | POA: Diagnosis not present

## 2019-08-27 DIAGNOSIS — D2272 Melanocytic nevi of left lower limb, including hip: Secondary | ICD-10-CM | POA: Diagnosis not present

## 2019-08-27 DIAGNOSIS — L570 Actinic keratosis: Secondary | ICD-10-CM | POA: Diagnosis not present

## 2019-08-27 DIAGNOSIS — D225 Melanocytic nevi of trunk: Secondary | ICD-10-CM | POA: Diagnosis not present

## 2019-08-27 DIAGNOSIS — Z85828 Personal history of other malignant neoplasm of skin: Secondary | ICD-10-CM | POA: Diagnosis not present

## 2019-08-27 DIAGNOSIS — D2261 Melanocytic nevi of right upper limb, including shoulder: Secondary | ICD-10-CM | POA: Diagnosis not present

## 2019-08-27 DIAGNOSIS — D2262 Melanocytic nevi of left upper limb, including shoulder: Secondary | ICD-10-CM | POA: Diagnosis not present

## 2019-08-29 DIAGNOSIS — Z1231 Encounter for screening mammogram for malignant neoplasm of breast: Secondary | ICD-10-CM | POA: Diagnosis not present

## 2019-09-04 ENCOUNTER — Ambulatory Visit (INDEPENDENT_AMBULATORY_CARE_PROVIDER_SITE_OTHER): Payer: Medicare HMO

## 2019-09-04 ENCOUNTER — Other Ambulatory Visit: Payer: Self-pay

## 2019-09-04 VITALS — BP 108/72 | HR 76 | Temp 97.7°F | Resp 16 | Ht 62.0 in | Wt 141.2 lb

## 2019-09-04 DIAGNOSIS — Z0000 Encounter for general adult medical examination without abnormal findings: Secondary | ICD-10-CM

## 2019-09-04 NOTE — Patient Instructions (Signed)
Carmen Clarke , Thank you for taking time to come for your Medicare Wellness Visit. I appreciate your ongoing commitment to your health goals. Please review the following plan we discussed and let me know if I can assist you in the future.   Screening recommendations/referrals: Colonoscopy: done 09/09/12 Mammogram: done 09/01/19  Bone Density: done 06/13/19 Recommended yearly ophthalmology/optometry visit for glaucoma screening and checkup Recommended yearly dental visit for hygiene and checkup  Vaccinations: Influenza vaccine: done 05/28/19 Pneumococcal vaccine: done 07/13/17 Tdap vaccine: done 05/14/15 Shingles vaccine: Shingrix discussed. Please contact your pharmacy for coverage information.   Conditions/risks identified: Recommend increasing physical activity   Next appointment: Please follow up in one year for your Medicare Annual Wellness visit.     Preventive Care 69 Years and Older, Female Preventive care refers to lifestyle choices and visits with your health care provider that can promote health and wellness. What does preventive care include?  A yearly physical exam. This is also called an annual well check.  Dental exams once or twice a year.  Routine eye exams. Ask your health care provider how often you should have your eyes checked.  Personal lifestyle choices, including:  Daily care of your teeth and gums.  Regular physical activity.  Eating a healthy diet.  Avoiding tobacco and drug use.  Limiting alcohol use.  Practicing safe sex.  Taking low-dose aspirin every day.  Taking vitamin and mineral supplements as recommended by your health care provider. What happens during an annual well check? The services and screenings done by your health care provider during your annual well check will depend on your age, overall health, lifestyle risk factors, and family history of disease. Counseling  Your health care provider may ask you questions about  your:  Alcohol use.  Tobacco use.  Drug use.  Emotional well-being.  Home and relationship well-being.  Sexual activity.  Eating habits.  History of falls.  Memory and ability to understand (cognition).  Work and work Statistician.  Reproductive health. Screening  You may have the following tests or measurements:  Height, weight, and BMI.  Blood pressure.  Lipid and cholesterol levels. These may be checked every 5 years, or more frequently if you are over 65 years old.  Skin check.  Lung cancer screening. You may have this screening every year starting at age 60 if you have a 30-pack-year history of smoking and currently smoke or have quit within the past 15 years.  Fecal occult blood test (FOBT) of the stool. You may have this test every year starting at age 40.  Flexible sigmoidoscopy or colonoscopy. You may have a sigmoidoscopy every 5 years or a colonoscopy every 10 years starting at age 71.  Hepatitis C blood test.  Hepatitis B blood test.  Sexually transmitted disease (STD) testing.  Diabetes screening. This is done by checking your blood sugar (glucose) after you have not eaten for a while (fasting). You may have this done every 1-3 years.  Bone density scan. This is done to screen for osteoporosis. You may have this done starting at age 93.  Mammogram. This may be done every 1-2 years. Talk to your health care provider about how often you should have regular mammograms. Talk with your health care provider about your test results, treatment options, and if necessary, the need for more tests. Vaccines  Your health care provider may recommend certain vaccines, such as:  Influenza vaccine. This is recommended every year.  Tetanus, diphtheria, and acellular pertussis (Tdap, Td)  vaccine. You may need a Td booster every 10 years.  Zoster vaccine. You may need this after age 58.  Pneumococcal 13-valent conjugate (PCV13) vaccine. One dose is recommended  after age 34.  Pneumococcal polysaccharide (PPSV23) vaccine. One dose is recommended after age 22. Talk to your health care provider about which screenings and vaccines you need and how often you need them. This information is not intended to replace advice given to you by your health care provider. Make sure you discuss any questions you have with your health care provider. Document Released: 08/27/2015 Document Revised: 04/19/2016 Document Reviewed: 06/01/2015 Elsevier Interactive Patient Education  2017 [place] Prevention in the Home Falls can cause injuries. They can happen to people of all ages. There are many things you can do to make your home safe and to help prevent falls. What can I do on the outside of my home?  Regularly fix the edges of walkways and driveways and fix any cracks.  Remove anything that might make you trip as you walk through a door, such as a raised step or threshold.  Trim any bushes or trees on the path to your home.  Use bright outdoor lighting.  Clear any walking paths of anything that might make someone trip, such as rocks or tools.  Regularly check to see if handrails are loose or broken. Make sure that both sides of any steps have handrails.  Any raised decks and porches should have guardrails on the edges.  Have any leaves, snow, or ice cleared regularly.  Use sand or salt on walking paths during winter.  Clean up any spills in your garage right away. This includes oil or grease spills. What can I do in the bathroom?  Use night lights.  Install grab bars by the toilet and in the tub and shower. Do not use towel bars as grab bars.  Use non-skid mats or decals in the tub or shower.  If you need to sit down in the shower, use a plastic, non-slip stool.  Keep the floor dry. Clean up any water that spills on the floor as soon as it happens.  Remove soap buildup in the tub or shower regularly.  Attach bath mats securely with  double-sided non-slip rug tape.  Do not have throw rugs and other things on the floor that can make you trip. What can I do in the bedroom?  Use night lights.  Make sure that you have a light by your bed that is easy to reach.  Do not use any sheets or blankets that are too big for your bed. They should not hang down onto the floor.  Have a firm chair that has side arms. You can use this for support while you get dressed.  Do not have throw rugs and other things on the floor that can make you trip. What can I do in the kitchen?  Clean up any spills right away.  Avoid walking on wet floors.  Keep items that you use a lot in easy-to-reach places.  If you need to reach something above you, use a strong step stool that has a grab bar.  Keep electrical cords out of the way.  Do not use floor polish or wax that makes floors slippery. If you must use wax, use non-skid floor wax.  Do not have throw rugs and other things on the floor that can make you trip. What can I do with my stairs?  Do not leave  any items on the stairs.  Make sure that there are handrails on both sides of the stairs and use them. Fix handrails that are broken or loose. Make sure that handrails are as long as the stairways.  Check any carpeting to make sure that it is firmly attached to the stairs. Fix any carpet that is loose or worn.  Avoid having throw rugs at the top or bottom of the stairs. If you do have throw rugs, attach them to the floor with carpet tape.  Make sure that you have a light switch at the top of the stairs and the bottom of the stairs. If you do not have them, ask someone to add them for you. What else can I do to help prevent falls?  Wear shoes that:  Do not have high heels.  Have rubber bottoms.  Are comfortable and fit you well.  Are closed at the toe. Do not wear sandals.  If you use a stepladder:  Make sure that it is fully opened. Do not climb a closed stepladder.  Make  sure that both sides of the stepladder are locked into place.  Ask someone to hold it for you, if possible.  Clearly mark and make sure that you can see:  Any grab bars or handrails.  First and last steps.  Where the edge of each step is.  Use tools that help you move around (mobility aids) if they are needed. These include:  Canes.  Walkers.  Scooters.  Crutches.  Turn on the lights when you go into a dark area. Replace any light bulbs as soon as they burn out.  Set up your furniture so you have a clear path. Avoid moving your furniture around.  If any of your floors are uneven, fix them.  If there are any pets around you, be aware of where they are.  Review your medicines with your doctor. Some medicines can make you feel dizzy. This can increase your chance of falling. Ask your doctor what other things that you can do to help prevent falls. This information is not intended to replace advice given to you by your health care provider. Make sure you discuss any questions you have with your health care provider. Document Released: 05/27/2009 Document Revised: 01/06/2016 Document Reviewed: 09/04/2014 Elsevier Interactive Patient Education  2017 [employer].

## 2019-09-04 NOTE — Progress Notes (Signed)
Subjective:   Carmen Clarke is a 69 y.o. female who presents for Medicare Annual (Subsequent) preventive examination.  Review of Systems:   Cardiac Risk Factors include: advanced age (>13men, >22 women);dyslipidemia;hypertension     Objective:     Vitals: BP 108/72 (BP Location: Right Arm, Patient Position: Sitting, Cuff Size: Normal)   Pulse 76   Temp 97.7 F (36.5 C) (Temporal)   Resp 16   Ht 5\' 2"  (1.575 m)   Wt 141 lb 3.2 oz (64 kg)   SpO2 98%   BMI 25.83 kg/m   Body mass index is 25.83 kg/m.  Advanced Directives 09/04/2019 08/30/2018 06/30/2016  Does Patient Have a Medical Advance Directive? Yes Yes Yes  Type of Paramedic of [place];Living will [place];Living will Living will;Healthcare Power of [place] in Chart? Yes - validated most recent copy scanned in chart (See row information) Yes - validated most recent copy scanned in chart (See row information) -    Tobacco Social History   Tobacco Use  Smoking Status Never Smoker  Smokeless Tobacco Never Used     Counseling given: Not Answered   Clinical Intake:  Pre-visit preparation completed: Yes  Pain : No/denies pain     BMI - recorded: 25.83 Nutritional Status: BMI 25 -29 Overweight Nutritional Risks: None Diabetes: No  How often do you need to have someone help you when you read instructions, pamphlets, or other written materials from your doctor or pharmacy?: 1 - Never  Interpreter Needed?: No  Information entered by :: Clemetine Marker LPN  Past Medical History:  Diagnosis Date  . Fracture of patella, left, closed 03/28/2017   2018  . Hyperlipidemia   . Hypertension   . Menopausal symptoms    previous HRT  . Osteopenia   . Vitamin D deficiency disease    Past Surgical History:  Procedure Laterality Date  . COLONOSCOPY  2014  . FEMUR FRACTURE SURGERY    . FOOT SURGERY  Aug. 2016  . HIP FRACTURE SURGERY     . knee arthoscopy Left    partial medial and lateral meniscetomy with removal of deep hardware from left distal femur   Family History  Problem Relation Age of Onset  . Diabetes Mother   . Hypertension Mother   . Hypertension Father   . Cancer Neg Hx   . COPD Neg Hx   . Heart disease Neg Hx   . Stroke Neg Hx    Social History   Socioeconomic History  . Marital status: Married    Spouse name: Not on file  . Number of children: 1  . Years of education: Not on file  . Highest education level: High school graduate  Occupational History  . Occupation: retired  Tobacco Use  . Smoking status: Never Smoker  . Smokeless tobacco: Never Used  Substance and Sexual Activity  . Alcohol use: No    Alcohol/week: 0.0 standard drinks  . Drug use: No  . Sexual activity: Yes  Other Topics Concern  . Not on file  Social History Narrative  . Not on file   Social Determinants of Health   Financial Resource Strain:   . Difficulty of Paying Living Expenses: Not on file  Food Insecurity:   . Worried About Charity fundraiser in the Last Year: Not on file  . Ran Out of Food in the Last Year: Not on file  Transportation Needs:   .  Lack of Transportation (Medical): Not on file  . Lack of Transportation (Non-Medical): Not on file  Physical Activity: Insufficiently Active  . Days of Exercise per Week: 2 days  . Minutes of Exercise per Session: 30 min  Stress: No Stress Concern Present  . Feeling of Stress : Only a little  Social Connections: Slightly Isolated  . Frequency of Communication with Friends and Family: More than three times a week  . Frequency of Social Gatherings with Friends and Family: More than three times a week  . Attends Religious Services: More than 4 times per year  . Active Member of Clubs or Organizations: No  . Attends Archivist Meetings: Never  . Marital Status: Married    Outpatient Encounter Medications as of 09/04/2019  Medication Sig  . aspirin  EC 81 MG tablet Take 81 mg by mouth daily.  . Calcium Carbonate-Vit D-Min (CALCIUM 1200) 1200-1000 MG-UNIT CHEW Chew 1 capsule by mouth daily.  Marland Kitchen lisinopril-hydrochlorothiazide (ZESTORETIC) 10-12.5 MG tablet Take 1 tablet by mouth daily.  . simvastatin (ZOCOR) 20 MG tablet Take 1 tablet (20 mg total) by mouth at bedtime.   No facility-administered encounter medications on file as of 09/04/2019.    Activities of Daily Living In your present state of health, do you have any difficulty performing the following activities: 09/04/2019 07/18/2019  Hearing? N N  Comment declines hearing aids -  Vision? N N  Difficulty concentrating or making decisions? N N  Walking or climbing stairs? N N  Dressing or bathing? N N  Doing errands, shopping? N N  Preparing Food and eating ? N -  Using the Toilet? N -  In the past six months, have you accidently leaked urine? N -  Do you have problems with loss of bowel control? N -  Managing your Medications? N -  Managing your Finances? N -  Housekeeping or managing your Housekeeping? N -  Some recent data might be hidden    Patient Care Team: Delsa Grana, PA-C as PCP - General (Family Medicine) Anell Barr, OD as Consulting Physician (Optometry) Wilhelmina Mcardle., MD as Consulting Physician (Dentistry) Dasher, Rayvon Char, MD (Dermatology) Lovell Sheehan, MD as Consulting Physician (Orthopedic Surgery)    Assessment:   This is a routine wellness examination for [place].  Exercise Activities and Dietary recommendations Current Exercise Habits: Home exercise routine, Type of exercise: Other - see comments(exercise bike), Time (Minutes): 30, Frequency (Times/Week): 2, Weekly Exercise (Minutes/Week): 60, Intensity: Moderate, Exercise limited by: None identified  Goals    . Increase physical activity     Pt would like to increase physical activity to 150 minutes per week     . Prevent falls       Fall Risk Fall Risk  09/04/2019 07/18/2019 01/17/2019  08/30/2018 07/05/2018  Falls in the past year? 0 0 1 0 0  Number falls in past yr: 0 0 0 0 0  Injury with Fall? 0 0 1 - 0  Risk for fall due to : No Fall Risks - - - -  Follow up Falls prevention discussed - - Falls prevention discussed -   FALL RISK PREVENTION PERTAINING TO THE HOME:  Any stairs in or around the home? Yes  If so, do they handrails? Yes   Home free of loose throw rugs in walkways, pet beds, electrical cords, etc? Yes  Adequate lighting in your home to reduce risk of falls? Yes   ASSISTIVE DEVICES UTILIZED TO PREVENT FALLS:  Life alert? No  Use of a cane, walker or w/c? No  Grab bars in the bathroom? Yes  Shower chair or bench in shower? No  Elevated toilet seat or a handicapped toilet? Yes   DME ORDERS:  DME order needed?  No   TIMED UP AND GO:  Was the test performed? Yes .  Length of time to ambulate 10 feet: 5 sec.   GAIT:  Appearance of gait: Gait stead-fast and without the use of an assistive device.   Education: Fall risk prevention has been discussed.  Intervention(s) required? No   Depression Screen PHQ 2/9 Scores 09/04/2019 07/18/2019 01/17/2019 08/30/2018  PHQ - 2 Score 0 0 0 0  PHQ- 9 Score - 0 - 1     Cognitive Function - 6CIT deferred for 2021 AWV. Pt has no memory issues.      6CIT Screen 08/30/2018  What Year? 0 points  What month? 0 points  What time? 0 points  Count back from 20 0 points  Months in reverse 0 points  Repeat phrase 0 points  Total Score 0    Immunization History  Administered Date(s) Administered  . Fluad Quad(high Dose 65+) 05/28/2019  . Influenza, High Dose Seasonal PF 06/30/2016, 06/01/2017, 05/16/2018  . Influenza,inj,Quad PF,6+ Mos 05/14/2015  . Pneumococcal Conjugate-13 09/24/2015  . Pneumococcal Polysaccharide-23 07/13/2017  . Tdap 05/14/2015  . Zoster 06/14/2012  . Zoster Recombinat (Shingrix) 01/17/2018, 04/04/2018    Qualifies for Shingles Vaccine? Shingrix series completed.   Tdap: Up to  date  Flu Vaccine: Up to date  Pneumococcal Vaccine: Up to date   Screening Tests Health Maintenance  Topic Date Due  . MAMMOGRAM  08/28/2020  . DEXA SCAN  06/12/2021  . COLONOSCOPY  09/09/2022  . TETANUS/TDAP  05/13/2025  . INFLUENZA VACCINE  Completed  . PNA vac Low Risk Adult  Completed  . Hepatitis C Screening  Addressed    Cancer Screenings:  Colorectal Screening: Completed 09/09/12. Repeat every 10 years  Mammogram: Completed 09/01/19. Repeat every year.   Bone Density: Completed 06/13/19. Results reflect OSTEOPENIA. Repeat every 2 years.   Lung Cancer Screening: (Low Dose CT Chest recommended if Age 11-80 years, 30 pack-year currently smoking OR have quit w/in 15years.) does not qualify.   Additional Screening:  Hepatitis C Screening: does qualify; Completed 12/18/14  Vision Screening: Recommended annual ophthalmology exams for early detection of glaucoma and other disorders of the eye. Is the patient up to date with their annual eye exam?  Yes  Who is the provider or what is the name of the office in which the pt attends annual eye exams? [place] Screening: Recommended annual dental exams for proper oral hygiene  Community Resource Referral:  CRR required this visit?  No      Plan:     I have personally reviewed and addressed the Medicare Annual Wellness questionnaire and have noted the following in the patient's chart:  A. Medical and social history B. Use of alcohol, tobacco or illicit drugs  C. Current medications and supplements D. Functional ability and status E.  Nutritional status F.  Physical activity G. Advance directives H. List of other physicians I.  Hospitalizations, surgeries, and ER visits in previous 12 months J.  [place] such as hearing and vision if needed, cognitive and depression L. Referrals and appointments   In addition, I have reviewed and discussed with patient certain preventive protocols, quality  metrics, and best practice recommendations. A  written personalized care plan for preventive services as well as general preventive health recommendations were provided to patient.   Signed,  Clemetine Marker, LPN Nurse Health Advisor   Nurse Notes: none

## 2019-09-23 ENCOUNTER — Ambulatory Visit: Payer: Medicare HMO | Attending: Internal Medicine

## 2019-09-23 DIAGNOSIS — Z20822 Contact with and (suspected) exposure to covid-19: Secondary | ICD-10-CM

## 2019-09-24 ENCOUNTER — Encounter: Payer: Self-pay | Admitting: Family Medicine

## 2019-09-24 LAB — NOVEL CORONAVIRUS, NAA: SARS-CoV-2, NAA: NOT DETECTED

## 2019-11-04 DIAGNOSIS — M25511 Pain in right shoulder: Secondary | ICD-10-CM | POA: Diagnosis not present

## 2019-11-07 DIAGNOSIS — M25511 Pain in right shoulder: Secondary | ICD-10-CM | POA: Diagnosis not present

## 2019-11-07 DIAGNOSIS — M25611 Stiffness of right shoulder, not elsewhere classified: Secondary | ICD-10-CM | POA: Diagnosis not present

## 2019-11-13 DIAGNOSIS — M25611 Stiffness of right shoulder, not elsewhere classified: Secondary | ICD-10-CM | POA: Diagnosis not present

## 2019-11-13 DIAGNOSIS — M25511 Pain in right shoulder: Secondary | ICD-10-CM | POA: Diagnosis not present

## 2019-11-18 DIAGNOSIS — M25511 Pain in right shoulder: Secondary | ICD-10-CM | POA: Diagnosis not present

## 2019-11-18 DIAGNOSIS — M25611 Stiffness of right shoulder, not elsewhere classified: Secondary | ICD-10-CM | POA: Diagnosis not present

## 2019-11-20 DIAGNOSIS — M25511 Pain in right shoulder: Secondary | ICD-10-CM | POA: Diagnosis not present

## 2019-11-20 DIAGNOSIS — M25611 Stiffness of right shoulder, not elsewhere classified: Secondary | ICD-10-CM | POA: Diagnosis not present

## 2019-11-24 DIAGNOSIS — M25511 Pain in right shoulder: Secondary | ICD-10-CM | POA: Diagnosis not present

## 2019-11-24 DIAGNOSIS — M25611 Stiffness of right shoulder, not elsewhere classified: Secondary | ICD-10-CM | POA: Diagnosis not present

## 2019-11-26 DIAGNOSIS — M25511 Pain in right shoulder: Secondary | ICD-10-CM | POA: Diagnosis not present

## 2019-11-26 DIAGNOSIS — M25611 Stiffness of right shoulder, not elsewhere classified: Secondary | ICD-10-CM | POA: Diagnosis not present

## 2019-12-01 DIAGNOSIS — M25511 Pain in right shoulder: Secondary | ICD-10-CM | POA: Diagnosis not present

## 2019-12-01 DIAGNOSIS — M25611 Stiffness of right shoulder, not elsewhere classified: Secondary | ICD-10-CM | POA: Diagnosis not present

## 2019-12-03 ENCOUNTER — Emergency Department: Payer: Medicare HMO

## 2019-12-03 ENCOUNTER — Encounter: Payer: Self-pay | Admitting: *Deleted

## 2019-12-03 ENCOUNTER — Other Ambulatory Visit: Payer: Self-pay

## 2019-12-03 DIAGNOSIS — D259 Leiomyoma of uterus, unspecified: Secondary | ICD-10-CM | POA: Diagnosis not present

## 2019-12-03 DIAGNOSIS — R109 Unspecified abdominal pain: Secondary | ICD-10-CM | POA: Insufficient documentation

## 2019-12-03 DIAGNOSIS — Z79899 Other long term (current) drug therapy: Secondary | ICD-10-CM | POA: Diagnosis not present

## 2019-12-03 DIAGNOSIS — R0789 Other chest pain: Secondary | ICD-10-CM | POA: Insufficient documentation

## 2019-12-03 DIAGNOSIS — I10 Essential (primary) hypertension: Secondary | ICD-10-CM | POA: Insufficient documentation

## 2019-12-03 DIAGNOSIS — R079 Chest pain, unspecified: Secondary | ICD-10-CM | POA: Diagnosis not present

## 2019-12-03 LAB — BASIC METABOLIC PANEL
Anion gap: 9 (ref 5–15)
BUN: 27 mg/dL — ABNORMAL HIGH (ref 8–23)
CO2: 27 mmol/L (ref 22–32)
Calcium: 9.3 mg/dL (ref 8.9–10.3)
Chloride: 103 mmol/L (ref 98–111)
Creatinine, Ser: 0.73 mg/dL (ref 0.44–1.00)
GFR calc Af Amer: >60 mL/min (ref >60)
GFR calc non Af Amer: >60 mL/min (ref >60)
Glucose, Bld: 117 mg/dL — ABNORMAL HIGH (ref 70–99)
Potassium: 4.2 mmol/L (ref 3.5–5.1)
Sodium: 139 mmol/L (ref 135–145)

## 2019-12-03 LAB — CBC
HCT: 41.1 % (ref 36.0–46.0)
Hemoglobin: 13.9 g/dL (ref 12.0–15.0)
MCH: 31.2 pg (ref 26.0–34.0)
MCHC: 33.8 g/dL (ref 30.0–36.0)
MCV: 92.2 fL (ref 80.0–100.0)
Platelets: 316 10*3/uL (ref 150–400)
RBC: 4.46 MIL/uL (ref 3.87–5.11)
RDW: 12 % (ref 11.5–15.5)
WBC: 8.3 10*3/uL (ref 4.0–10.5)
nRBC: 0 % (ref 0.0–0.2)

## 2019-12-03 LAB — TROPONIN I (HIGH SENSITIVITY): Troponin I (High Sensitivity): 4 ng/L (ref <18)

## 2019-12-03 MED ORDER — SODIUM CHLORIDE 0.9% FLUSH: 3.0000 mL | Freq: Once | INTRAVENOUS | Status: DC

## 2019-12-03 NOTE — ED Triage Notes (Signed)
Pt has left side chest pain since yesterday.  Pain radiates into the back.  No n/v/d.  Pt states pain worse tonight.  Pt alert

## 2019-12-04 ENCOUNTER — Emergency Department
Admission: EM | Admit: 2019-12-04 | Discharge: 2019-12-04 | Disposition: A | Discharge status: Home / Self Care | Payer: Medicare HMO | Attending: Emergency Medicine | Admitting: Emergency Medicine

## 2019-12-04 ENCOUNTER — Emergency Department: Payer: Medicare HMO

## 2019-12-04 ENCOUNTER — Encounter: Payer: Self-pay | Admitting: Radiology

## 2019-12-04 DIAGNOSIS — R0789 Other chest pain: Secondary | ICD-10-CM

## 2019-12-04 DIAGNOSIS — R079 Chest pain, unspecified: Secondary | ICD-10-CM | POA: Diagnosis not present

## 2019-12-04 DIAGNOSIS — D259 Leiomyoma of uterus, unspecified: Secondary | ICD-10-CM | POA: Diagnosis not present

## 2019-12-04 LAB — HEPATIC FUNCTION PANEL
ALT: 21 U/L (ref 0–44)
AST: 21 U/L (ref 15–41)
Albumin: 4.6 g/dL (ref 3.5–5.0)
Alkaline Phosphatase: 80 U/L (ref 38–126)
Bilirubin, Direct: <0.1 mg/dL (ref 0.0–0.2)
Total Bilirubin: 0.5 mg/dL (ref 0.3–1.2)
Total Protein: 7.4 g/dL (ref 6.5–8.1)

## 2019-12-04 LAB — URINALYSIS, COMPLETE (UACMP) WITH MICROSCOPIC
Bacteria, UA: NONE SEEN
Bilirubin Urine: NEGATIVE
Glucose, UA: NEGATIVE mg/dL
Hgb urine dipstick: NEGATIVE
Ketones, ur: NEGATIVE mg/dL
Leukocytes,Ua: NEGATIVE
Nitrite: NEGATIVE
Protein, ur: NEGATIVE mg/dL
Specific Gravity, Urine: 1.025 (ref 1.005–1.030)
pH: 5 (ref 5.0–8.0)

## 2019-12-04 LAB — LIPASE, BLOOD: Lipase: 28 U/L (ref 11–51)

## 2019-12-04 LAB — TROPONIN I (HIGH SENSITIVITY): Troponin I (High Sensitivity): 5 ng/L (ref <18)

## 2019-12-04 MED ORDER — ALUM & MAG HYDROXIDE-SIMETH 400-400-40 MG/5ML PO SUSP: 5.0000 mL | ORAL | 0 refills | Status: DC | PRN

## 2019-12-04 MED ORDER — ALUM & MAG HYDROXIDE-SIMETH 200-200-20 MG/5ML PO SUSP
30.0000 mL | Freq: Once | ORAL | Status: AC
  Administered 2019-12-04: 30 mL via ORAL
  Filled 2019-12-04: qty 30

## 2019-12-04 MED ORDER — IOHEXOL 350 MG/ML SOLN
75.0000 mL | Freq: Once | INTRAVENOUS | Status: AC | PRN
  Administered 2019-12-04: 08:00:00 75 mL via INTRAVENOUS

## 2019-12-04 MED ORDER — FAMOTIDINE IN NACL 20-0.9 MG/50ML-% IV SOLN
20.0000 mg | Freq: Once | INTRAVENOUS | Status: AC
  Administered 2019-12-04: 06:00:00 20 mg via INTRAVENOUS
  Filled 2019-12-04: qty 50

## 2019-12-04 MED ORDER — FAMOTIDINE 20 MG PO TABS: 20.0000 mg | ORAL_TABLET | Freq: Two times a day (BID) | ORAL | 1 refills | Status: DC

## 2019-12-04 NOTE — ED Provider Notes (Signed)
[facility] Emergency Department Provider Note  ____________________________________________  Time seen: Approximately 6:14 AM  I have reviewed the triage vital signs and the nursing notes.   HISTORY  Chief Complaint Chest Pain   HPI Carmen Clarke is a 69 y.o. female history of hypertension, hyperlipidemia, esophageal stricture who presents for evaluation of chest pain.  Patient reports intermittent chest pain for the last 2 days.  She describes the pain as sharp, they usually start underneath her left breast radiate up in the center of her chest and to the left flank.  She has had several episodes over the last 2 days lasting a couple of minutes at a time.  They resolve without intervention.  She feels that they are worse when she is laying flat.  At this time she has no chest pain.  She denies any associated symptoms such as shortness of breath, dizziness, nausea, vomiting, diarrhea or constipation.  She denies dysuria or hematuria or any prior history of kidney stones.  She denies personal or family history of PE or DVT, recent travel immobilization, leg pain or swelling, hemoptysis, or exogenous hormones.  Of note 2 weeks ago patient reports having some rice and after that had severe burning pain from her throat all the way down the center of her chest to her epigastrium.  She called her GI doctor for an appointment because she had in the past esophageal stricture requiring dilation.  She reports fully recovering from that event.   No NSAID use, no alcohol use, no smoking.  No personal or family history of heart attacks.  Past Medical History:  Diagnosis Date  . Fracture of patella, left, closed 03/28/2017   2018  . Hyperlipidemia   . Hypertension   . Menopausal symptoms    previous HRT  . Osteopenia   . Vitamin D deficiency disease     Patient Active Problem List   Diagnosis Date Noted  . Bursitis of hip 08/30/2018  . Full code status 01/11/2018  .  Fracture of patella, left, closed 03/28/2017  . Medication monitoring encounter 11/09/2015  . Arm pain, left 09/28/2015  . Shoulder pain, left 09/28/2015  . Preventative health care 05/18/2015  . Overweight (BMI 25.0-29.9) 05/14/2015  . Breast cancer screening 05/14/2015  . Hypertension   . Hyperlipidemia   . Osteopenia   . Vitamin D deficiency disease     Past Surgical History:  Procedure Laterality Date  . COLONOSCOPY  2014  . FEMUR FRACTURE SURGERY    . FOOT SURGERY  Aug. 2016  . HIP FRACTURE SURGERY    . knee arthoscopy Left    partial medial and lateral meniscetomy with removal of deep hardware from left distal femur    Prior to Admission medications   Medication Sig Start Date End Date Taking? Authorizing Provider  alum & mag hydroxide-simeth (MAALOX MAX) 400-400-40 MG/5ML suspension Take 5 mLs by mouth every 4 (four) hours as needed for indigestion. 12/04/19   Rudene Re, MD  aspirin EC 81 MG tablet Take 81 mg by mouth daily.    [provider]  Calcium Carbonate-Vit D-Min (CALCIUM 1200) 1200-1000 MG-UNIT CHEW Chew 1 capsule by mouth daily.    [provider]  famotidine (PEPCID) 20 MG tablet Take 1 tablet (20 mg total) by mouth 2 (two) times daily. 12/04/19 12/03/20  Rudene Re, MD  lisinopril-hydrochlorothiazide (ZESTORETIC) 10-12.5 MG tablet Take 1 tablet by mouth daily. 07/18/19   Delsa Grana, PA-C  simvastatin (ZOCOR) 20 MG  tablet Take 1 tablet (20 mg total) by mouth at bedtime. 01/17/19   Hubbard Hartshorn, FNP    Allergies Patient has no known allergies.  Family History  Problem Relation Age of Onset  . Diabetes Mother   . Hypertension Mother   . Hypertension Father   . Cancer Neg Hx   . COPD Neg Hx   . Heart disease Neg Hx   . Stroke Neg Hx     Social History Social History   Tobacco Use  . Smoking status: Never Smoker  . Smokeless tobacco: Never Used  Substance Use Topics  . Alcohol use: No    Alcohol/week: 0.0 standard  drinks  . Drug use: No    Review of Systems  Constitutional: Negative for fever. Eyes: Negative for visual changes. ENT: Negative for sore throat. Neck: No neck pain  Cardiovascular: + chest pain. Respiratory: Negative for shortness of breath. Gastrointestinal: Negative for abdominal pain, vomiting or diarrhea. Genitourinary: Negative for dysuria. Musculoskeletal: Negative for back pain. Skin: Negative for rash. Neurological: Negative for headaches, weakness or numbness. Psych: No SI or HI  ____________________________________________   PHYSICAL EXAM:  VITAL SIGNS: ED Triage Vitals  Enc Vitals Group     BP 12/03/19 2143 (!) 155/84     Pulse Rate 12/03/19 2143 80     Resp 12/03/19 2143 18     Temp 12/03/19 2143 98.6 F (37 C)     Temp Source 12/03/19 2143 Oral     SpO2 12/03/19 2143 97 %     Weight 12/03/19 2144 140 lb (63.5 kg)     Height 12/03/19 2144 5\' 2"  (1.575 m)     Head Circumference --      Peak Flow --      Pain Score 12/03/19 2144 8     Pain Loc --      Pain Edu? --      Excl. in [place]? --     Constitutional: Alert and oriented. Well appearing and in no apparent distress. HEENT:      Head: Normocephalic and atraumatic.         Eyes: Conjunctivae are normal. Sclera is non-icteric.       Mouth/Throat: Mucous membranes are moist.       Neck: Supple with no signs of meningismus. Cardiovascular: Regular rate and rhythm. No murmurs, gallops, or rubs. 2+ symmetrical distal pulses are present in all extremities. No JVD. Respiratory: Normal respiratory effort. Lungs are clear to auscultation bilaterally. No wheezes, crackles, or rhonchi.  Gastrointestinal: Soft, non tender, and non distended with positive bowel sounds. No rebound or guarding. Genitourinary: No CVA tenderness. Musculoskeletal: . No edema, cyanosis, or erythema of extremities. Neurologic: Normal speech and language. Face is symmetric. Moving all extremities. No gross focal neurologic deficits are  appreciated. Skin: Skin is warm, dry and intact. No rash noted. Psychiatric: Mood and affect are normal. Speech and behavior are normal.  ____________________________________________   LABS (all labs ordered are listed, but only abnormal results are displayed)  Labs Reviewed  BASIC METABOLIC PANEL - Abnormal; Notable for the following components:      Result Value   Glucose, Bld 117 (*)    BUN 27 (*)    All other components within normal limits  URINALYSIS, COMPLETE (UACMP) WITH MICROSCOPIC - Abnormal; Notable for the following components:   Color, Urine YELLOW (*)    APPearance CLEAR (*)    All other components within normal limits  CBC  LIPASE, BLOOD  HEPATIC  FUNCTION PANEL  TROPONIN I (HIGH SENSITIVITY)  TROPONIN I (HIGH SENSITIVITY)   ____________________________________________  EKG  ED ECG REPORT I, Rudene Re, the attending physician, personally viewed and interpreted this ECG.   Normal sinus rhythm, rate of 75, normal intervals, normal axis, no ST elevations or depressions, diffuse T wave flattening with Q waves in inferior and anterior leads. Unchanged from prior ____________________________________________  RADIOLOGY  I have personally reviewed the images performed during this visit and I agree with the Radiologist's read.   Interpretation by Radiologist:  DG Chest 2 View  Result Date: 12/03/2019 CLINICAL DATA:  Left-sided chest pain since yesterday, pain radiating to back EXAM: CHEST - 2 VIEW COMPARISON:  04/23/2005 FINDINGS: The heart size and mediastinal contours are within normal limits. Both lungs are clear. The visualized skeletal structures are unremarkable. IMPRESSION: No active cardiopulmonary disease. Electronically Signed   By: Randa Ngo M.D.   On: 12/03/2019 22:06   CT Renal Stone Study  Result Date: 12/04/2019 CLINICAL DATA:  Left-sided chest and flank pain since yesterday. EXAM: CT ABDOMEN AND PELVIS WITHOUT CONTRAST TECHNIQUE:  Multidetector CT imaging of the abdomen and pelvis was performed following the standard protocol without IV contrast. COMPARISON:  None. FINDINGS: Lower chest: The lung bases are clear of acute process. No pleural effusion or pulmonary lesions. The heart is normal in size. No pericardial effusion. The distal descending thoracic aorta is unremarkable. There is a small hiatal hernia noted. Hepatobiliary: No hepatic lesions are identified without contrast. There is layering higher attenuation material in the dependent portion the gallbladder which could be gallstones or sludge. No findings for acute cholecystitis. No common bile duct dilatation. Pancreas: No mass, inflammation or ductal dilatation. Small focal fatty cleft noted in the pancreatic head. Spleen: Normal size. No focal lesions. Adrenals/Urinary Tract: The adrenal glands are unremarkable. Suspect bilateral parapelvic renal cysts. No renal, ureteral or bladder calculi are identified. No obvious renal or bladder lesions are identified without contrast. Stomach/Bowel: The stomach, duodenum, small bowel and colon are unremarkable without contrast. No acute inflammatory changes, mass lesions or obstructive findings. The terminal ileum is normal. The appendix is not identified. Vascular/Lymphatic: The aorta is normal in caliber. Minimal scattered atheroscerlotic calcifications. No mesenteric of retroperitoneal mass or adenopathy. Small scattered lymph nodes are noted. Reproductive: Calcified uterine fibroids are noted. The ovaries are unremarkable. Other: No pelvic mass or adenopathy. No free pelvic fluid collections. No inguinal mass or adenopathy. No abdominal wall hernia or subcutaneous lesions. Musculoskeletal: No acute bony findings or worrisome bone lesions. Remote posttraumatic changes involving the left hip. IMPRESSION: 1. No renal, ureteral or bladder calculi or mass. 2. Suspect bilateral parapelvic renal cysts. 3. No acute abdominal/pelvic findings, mass  lesions or adenopathy. 4. Calcified uterine fibroids. Electronically Signed   By: Marijo Sanes M.D.   On: 12/04/2019 06:55     ____________________________________________   PROCEDURES  Procedure(s) performed:yes .1-3 Lead EKG Interpretation Performed by: Rudene Re, MD Authorized by: Rudene Re, MD     Interpretation: normal     ECG rate:  75   ECG rate assessment: normal     Rhythm: sinus rhythm     Ectopy: none     Conduction: normal     Critical Care performed: yes  CRITICAL CARE Performed by: Rudene Re  ?  Total critical care time: 35 min  Critical care time was exclusive of separately billable procedures and treating other patients.  Critical care was necessary to treat or prevent imminent or life-threatening deterioration.  Critical care was time spent personally by me on the following activities: development of treatment plan with patient and/or surrogate as well as nursing, discussions with consultants, evaluation of patient's response to treatment, examination of patient, obtaining history from patient or surrogate, ordering and performing treatments and interventions, ordering and review of laboratory studies, ordering and review of radiographic studies, pulse oximetry and re-evaluation of patient's condition.  ____________________________________________   INITIAL IMPRESSION / ASSESSMENT AND PLAN / ED COURSE   69 y.o. female history of hypertension, hyperlipidemia, esophageal stricture who presents for evaluation of intermittent sharp L sided chest pain radiating to the L flank x 2 days.  Patient with dozens of episodes over the last 2 days that last several minutes at a time and resolve without intervention.  On exam she is extremely well-appearing in no distress with normal vital signs, palpation of the chest does not reproduce the pain, there is no rash, no CVA tenderness, no abdominal tenderness, lungs are clear to auscultation and  heart regular rate and rhythm.  She has strong equal pulses in all 4 extremities.  EKG is unchanged from baseline with no signs of acute ischemia or dysrhythmias.  Differential diagnoses including esophageal spasms versus abdominal ulcer versus kidney stone versus gallstones.  Less likely PE or dissection with intermittent symptoms but also possibility.  Atypical for ACS.  Normal EKG and 2 - high-sensitivity troponin.  Chest x-ray visualized by me and read as negative with no widened mediastinum, no pneumothorax, no edema, no pneumonia.  Patient has no pain right now.  We will do a CT renal to rule out kidney stone.  Will check urine.  Will check LFTs and lipase.  Will give Pepcid and Maalox in case these are esophageal spasms.   Old medical records have been reviewed.  History is gathered from patient and her husband who was in the room with her.  _________________________ 7:14 AM on 12/04/2019 -----------------------------------------  CT renal visualized and read by me is negative for kidney stone, confirmed by radiology.  UA is negative for UTI.  LFTs and lipase are within normal limits.  Patient reports no further episodes after receiving Maalox and IV Pepcid.  At this time I am leaning more towards esophageal spasm as etiology of her pain.  I would have her take Pepcid daily and Maalox as needed at home until she is able to see her GI doctor.  I also discussed avoiding food within 3 to 4 hours of bedtime and propping herself up to sleep.  However we will pursue a CT angiogram to rule out a PE.  Care transferred to Dr. Ellender Hose.     _____________________________________________ Please note:  Patient was evaluated in Emergency Department today for the symptoms described in the history of present illness. Patient was evaluated in the context of the global COVID-19 pandemic, which necessitated consideration that the patient might be at risk for infection with the SARS-CoV-2 virus that causes  COVID-19. Institutional protocols and algorithms that pertain to the evaluation of patients at risk for COVID-19 are in a state of rapid change based on information released by regulatory bodies including the CDC and federal and state organizations. These policies and algorithms were followed during the patient's care in the ED.  Some ED evaluations and interventions may be delayed as a result of limited staffing during the pandemic.   [place] Controlled Substance Database was reviewed by me. ____________________________________________   FINAL CLINICAL IMPRESSION(S) / ED DIAGNOSES   Final diagnoses:  Atypical chest  pain      NEW MEDICATIONS STARTED DURING THIS VISIT:  ED Discharge Orders         Ordered    alum & mag hydroxide-simeth (MAALOX MAX) [id number] MG/5ML suspension  Every 4 hours PRN     12/04/19 0716    famotidine (PEPCID) 20 MG tablet  2 times daily     12/04/19 [id number]           Note:  This document was prepared using Dragon voice recognition software and may include unintentional dictation errors.    Alfred Levins, [place], MD 12/04/19 [phone]

## 2019-12-04 NOTE — ED Notes (Signed)
Pt trx to CT.  

## 2019-12-04 NOTE — ED Provider Notes (Signed)
Assumed care from Dr. Alfred Levins at 7 AM. Briefly, the patient is a 69 y.o. female with PMHx of  has a past medical history of Fracture of patella, left, closed (03/28/2017), Hyperlipidemia, Hypertension, Menopausal symptoms, Osteopenia, and Vitamin D deficiency disease. here with left upper chest pain and abdominal pain. CT a/p negative. CT angio pending to r/o PE but likely esophageal/gastric pain. Feels much better with antacids here. Trop neg x 2 and EKG nonischemic, doubt ACS.   Labs Reviewed  BASIC METABOLIC PANEL - Abnormal; Notable for the following components:      Result Value   Glucose, Bld 117 (*)    BUN 27 (*)    All other components within normal limits  URINALYSIS, COMPLETE (UACMP) WITH MICROSCOPIC - Abnormal; Notable for the following components:   Color, Urine YELLOW (*)    APPearance CLEAR (*)    All other components within normal limits  CBC  LIPASE, BLOOD  HEPATIC FUNCTION PANEL  TROPONIN I (HIGH SENSITIVITY)  TROPONIN I (HIGH SENSITIVITY)    Course of Care: -CT Angio negative for PE, dissection, or other abnormality. Feels much better with GI meds. D/c with outpt follow-up and Gi regimen.      Duffy Bruce, MD 12/04/19 [phone]

## 2019-12-04 NOTE — ED Notes (Signed)
Pt to toilet at this time to provide urine sample.

## 2019-12-04 NOTE — Discharge Instructions (Addendum)
Take Pepcid 20 mg once a day.  Take Maalox as needed for pain.  Avoid spicy foods and NSAIDs.  Avoid eating within 3 to 4 hours of bedtime.  Try to prop yourself up to sleep.  Return to the emergency room for new or worsening chest pain or shortness of breath.  Follow-up with GI.

## 2019-12-04 NOTE — ED Notes (Signed)
Pt to CT

## 2019-12-08 DIAGNOSIS — M25511 Pain in right shoulder: Secondary | ICD-10-CM | POA: Diagnosis not present

## 2019-12-08 DIAGNOSIS — M25611 Stiffness of right shoulder, not elsewhere classified: Secondary | ICD-10-CM | POA: Diagnosis not present

## 2019-12-11 DIAGNOSIS — M25511 Pain in right shoulder: Secondary | ICD-10-CM | POA: Diagnosis not present

## 2019-12-11 DIAGNOSIS — M25611 Stiffness of right shoulder, not elsewhere classified: Secondary | ICD-10-CM | POA: Diagnosis not present

## 2019-12-12 ENCOUNTER — Ambulatory Visit (INDEPENDENT_AMBULATORY_CARE_PROVIDER_SITE_OTHER): Payer: Medicare HMO | Admitting: Family Medicine

## 2019-12-12 ENCOUNTER — Encounter: Payer: Self-pay | Admitting: Family Medicine

## 2019-12-12 ENCOUNTER — Other Ambulatory Visit: Payer: Self-pay

## 2019-12-12 VITALS — BP 124/82 | HR 83 | Temp 98.3°F | Resp 14 | Ht 62.0 in | Wt 144.1 lb

## 2019-12-12 DIAGNOSIS — R0789 Other chest pain: Secondary | ICD-10-CM

## 2019-12-12 DIAGNOSIS — K219 Gastro-esophageal reflux disease without esophagitis: Secondary | ICD-10-CM

## 2019-12-12 DIAGNOSIS — Z9889 Other specified postprocedural states: Secondary | ICD-10-CM

## 2019-12-12 MED ORDER — FAMOTIDINE 20 MG PO TABS: 20.0000 mg | ORAL_TABLET | Freq: Two times a day (BID) | ORAL | 1 refills | Status: DC | PRN

## 2019-12-12 MED ORDER — PANTOPRAZOLE SODIUM 40 MG PO TBEC: 40.0000 mg | DELAYED_RELEASE_TABLET | Freq: Every day | ORAL | 1 refills | Status: DC

## 2019-12-12 NOTE — Progress Notes (Signed)
Patient ID: Carmen Clarke, female    DOB: 07-01-1951, 69 y.o.   MRN: [id number]  PCP: Delsa Grana, PA-C  Chief Complaint  Patient presents with  . Dysphagia    Went to ER everything checked out with heart, has hx of stretched esphogaus    Subjective:   Carmen VANSICKLE is a 69 y.o. female, presents to clinic with CC of the following:  ER follow up Pt presented to the ER on 12/04/2019 CT abd/pelvis neg, CT angio also done, labs unremarkable, she did have a GI cocktail and was given Rx for pepcid, told to take mylanta and follow up with GI, she has appt in June.  Hx of GERD/dysphagia and hx of esophageal dilatation (dilatation many years ago, she doesn't remember who or where or any recommended f/up)  Chest Pain  This is a new problem. The current episode started 1 to 4 weeks ago. The onset quality is sudden. The problem occurs intermittently. The problem has been waxing and waning. Pain location: left upper quadrant of abd/left lower ribs and through central chest. The pain is severe. The quality of the pain is described as sharp. The pain radiates to the mid back and left shoulder. Associated symptoms include abdominal pain. Pertinent negatives include no cough, diaphoresis, dizziness, exertional chest pressure, fever, headaches, hemoptysis, irregular heartbeat, leg pain, lower extremity edema, malaise/fatigue, nausea, near-syncope, numbness, orthopnea, palpitations, shortness of breath, sputum production, syncope, vomiting or weakness. Associated symptoms comments: Some GERD sx about a week prior to onset of CP sx, burning from throat to stomach after eating rice with gravy - had N, indigestion, burping. Associated with: worse at night and laying down.    Since 12/04/2019 she is not having any severe CP, abd pain, N, V, dysphagia.  She still has some burping and she reports months of intermittent trouble with secretions and swallowing saliva at night when lying down, feels like it pools  in the back of her throat and she tries to swallow it and it comes back up several times.  She denies any other dysphagia of solids, liquids, denies choking episodes any current pain with swallowing, or CP, SOB, abd pain with swallowing, eating or shortly after eating.  No change in appetite.  No diarrhea.    All imaging and labs from ER visit personally reviewed by me today and reviewed and discussed with pt.       Patient Active Problem List   Diagnosis Date Noted  . Bursitis of hip 08/30/2018  . Full code status 01/11/2018  . Fracture of patella, left, closed 03/28/2017  . Medication monitoring encounter 11/09/2015  . Arm pain, left 09/28/2015  . Shoulder pain, left 09/28/2015  . Preventative health care 05/18/2015  . Overweight (BMI 25.0-29.9) 05/14/2015  . Breast cancer screening 05/14/2015  . Hypertension   . Hyperlipidemia   . Osteopenia   . Vitamin D deficiency disease       Current Outpatient Medications:  .  alum & mag hydroxide-simeth (MAALOX MAX) 400-400-40 MG/5ML suspension, Take 5 mLs by mouth every 4 (four) hours as needed for indigestion., Disp: 355 mL, Rfl: 0 .  aspirin EC 81 MG tablet, Take 81 mg by mouth daily., Disp: , Rfl:  .  Calcium Carbonate-Vit D-Min (CALCIUM 1200) 1200-1000 MG-UNIT CHEW, Chew 1 capsule by mouth daily., Disp: , Rfl:  .  famotidine (PEPCID) 20 MG tablet, Take 1 tablet (20 mg total) by mouth 2 (two) times daily., Disp: 60 tablet, Rfl: 1 .  lisinopril-hydrochlorothiazide (ZESTORETIC) 10-12.5 MG tablet, Take 1 tablet by mouth daily., Disp: 90 tablet, Rfl: 3 .  simvastatin (ZOCOR) 20 MG tablet, Take 1 tablet (20 mg total) by mouth at bedtime., Disp: 90 tablet, Rfl: 3   No Known Allergies   Family History  Problem Relation Age of Onset  . Diabetes Mother   . Hypertension Mother   . Hypertension Father   . Cancer Neg Hx   . COPD Neg Hx   . Heart disease Neg Hx   . Stroke Neg Hx      Social History   Socioeconomic History  .  Marital status: Married    Spouse name: Not on file  . Number of children: 1  . Years of education: Not on file  . Highest education level: High school graduate  Occupational History  . Occupation: retired  Tobacco Use  . Smoking status: Never Smoker  . Smokeless tobacco: Never Used  Substance and Sexual Activity  . Alcohol use: No    Alcohol/week: 0.0 standard drinks  . Drug use: No  . Sexual activity: Yes  Other Topics Concern  . Not on file  Social History Narrative  . Not on file   Social Determinants of Health   Financial Resource Strain:   . Difficulty of Paying Living Expenses:   Food Insecurity:   . Worried About Charity fundraiser in the Last Year:   . Arboriculturist in the Last Year:   Transportation Needs:   . Film/video editor (Medical):   Marland Kitchen Lack of Transportation (Non-Medical):   Physical Activity: Insufficiently Active  . Days of Exercise per Week: 2 days  . Minutes of Exercise per Session: 30 min  Stress: No Stress Concern Present  . Feeling of Stress : Only a little  Social Connections: Slightly Isolated  . Frequency of Communication with Friends and Family: More than three times a week  . Frequency of Social Gatherings with Friends and Family: More than three times a week  . Attends Religious Services: More than 4 times per year  . Active Member of Clubs or Organizations: No  . Attends Archivist Meetings: Never  . Marital Status: Married  Human resources officer Violence: Not At Risk  . Fear of Current or Ex-Partner: No  . Emotionally Abused: No  . Physically Abused: No  . Sexually Abused: No    Chart Review Today: I personally reviewed active problem list, medication list, allergies, family history, social history, health maintenance, notes from last encounter, lab results, imaging with the patient/caregiver today.   Review of Systems  Constitutional: Negative.  Negative for activity change, appetite change, chills, diaphoresis,  fatigue, fever and malaise/fatigue.  HENT: Negative.   Eyes: Negative.   Respiratory: Negative.  Negative for cough, hemoptysis, sputum production and shortness of breath.   Cardiovascular: Positive for chest pain. Negative for palpitations, orthopnea, syncope and near-syncope.  Gastrointestinal: Positive for abdominal pain. Negative for nausea and vomiting.  Endocrine: Negative.   Genitourinary: Negative.   Musculoskeletal: Negative.   Skin: Negative.   Allergic/Immunologic: Negative.   Neurological: Negative.  Negative for dizziness, weakness, numbness and headaches.  Hematological: Negative.   Psychiatric/Behavioral: Negative.   All other systems reviewed and are negative.      Objective:   Vitals:   12/12/19 0945  BP: 124/82  Pulse: 83  Resp: 14  Temp: 98.3 F (36.8 C)  SpO2: 97%  Weight: 144 lb 1.6 oz (65.4 kg)  Height: 5\' 2"  (  1.575 m)    Body mass index is 26.36 kg/m.  Physical Exam Vitals and nursing note reviewed.  Constitutional:      General: She is not in acute distress.    Appearance: Normal appearance. She is well-developed. She is not ill-appearing, toxic-appearing or diaphoretic.     Interventions: Face mask in place.  HENT:     Head: Normocephalic and atraumatic.     Right Ear: External ear normal.     Left Ear: External ear normal.  Eyes:     General: Lids are normal. No scleral icterus.       Right eye: No discharge.        Left eye: No discharge.     Conjunctiva/sclera: Conjunctivae normal.  Neck:     Thyroid: No thyroid mass or thyromegaly.     Trachea: Trachea and phonation normal. No tracheal deviation.  Cardiovascular:     Rate and Rhythm: Normal rate and regular rhythm.     Pulses: Normal pulses.          Radial pulses are 2+ on the right side and 2+ on the left side.       Posterior tibial pulses are 2+ on the right side and 2+ on the left side.     Heart sounds: Normal heart sounds. No murmur. No friction rub. No gallop.   Pulmonary:      Effort: Pulmonary effort is normal. No respiratory distress.     Breath sounds: Normal breath sounds. No stridor. No wheezing, rhonchi or rales.  Chest:     Chest wall: No tenderness.  Abdominal:     General: Bowel sounds are normal. There is no distension.     Palpations: Abdomen is soft.     Tenderness: There is no abdominal tenderness. There is no right CVA tenderness, left CVA tenderness, guarding or rebound.  Musculoskeletal:     Cervical back: Normal range of motion and neck supple.  Lymphadenopathy:     Cervical: No cervical adenopathy.  Skin:    General: Skin is warm and dry.     Coloration: Skin is not jaundiced or pale.     Findings: No rash.  Neurological:     Mental Status: She is alert and oriented to person, place, and time.     Motor: No abnormal muscle tone.     Gait: Gait normal.  Psychiatric:        Speech: Speech normal.        Behavior: Behavior normal.      Results for orders placed or performed during the hospital encounter of [id number]  Basic metabolic panel  Result Value Ref Range   Sodium 139 135 - 145 mmol/L   Potassium 4.2 3.5 - 5.1 mmol/L   Chloride 103 98 - 111 mmol/L   CO2 27 22 - 32 mmol/L   Glucose, Bld 117 (H) 70 - 99 mg/dL   BUN 27 (H) 8 - 23 mg/dL   Creatinine, Ser 0.73 0.44 - 1.00 mg/dL   Calcium 9.3 8.9 - 10.3 mg/dL   GFR calc non Af Amer >60 >60 mL/min   GFR calc Af Amer >60 >60 mL/min   Anion gap 9 5 - 15  CBC  Result Value Ref Range   WBC 8.3 4.0 - 10.5 K/uL   RBC 4.46 3.87 - 5.11 MIL/uL   Hemoglobin 13.9 12.0 - 15.0 g/dL   HCT 41.1 36.0 - 46.0 %   MCV 92.2 80.0 - 100.0 fL   MCH  31.2 26.0 - 34.0 pg   MCHC 33.8 30.0 - 36.0 g/dL   RDW 12.0 11.5 - 15.5 %   Platelets 316 150 - 400 K/uL   nRBC 0.0 0.0 - 0.2 %  Urinalysis, Complete w Microscopic  Result Value Ref Range   Color, Urine YELLOW (A) YELLOW   APPearance CLEAR (A) CLEAR   Specific Gravity, Urine 1.025 1.005 - 1.030   pH 5.0 5.0 - 8.0   Glucose, UA NEGATIVE  NEGATIVE mg/dL   Hgb urine dipstick NEGATIVE NEGATIVE   Bilirubin Urine NEGATIVE NEGATIVE   Ketones, ur NEGATIVE NEGATIVE mg/dL   Protein, ur NEGATIVE NEGATIVE mg/dL   Nitrite NEGATIVE NEGATIVE   Leukocytes,Ua NEGATIVE NEGATIVE   RBC / HPF 0-5 0 - 5 RBC/hpf   WBC, UA 0-5 0 - 5 WBC/hpf   Bacteria, UA NONE SEEN NONE SEEN   Squamous Epithelial / LPF 0-5 0 - 5   Mucus PRESENT   Lipase, blood  Result Value Ref Range   Lipase 28 11 - 51 U/L  Hepatic function panel  Result Value Ref Range   Total Protein 7.4 6.5 - 8.1 g/dL   Albumin 4.6 3.5 - 5.0 g/dL   AST 21 15 - 41 U/L   ALT 21 0 - 44 U/L   Alkaline Phosphatase 80 38 - 126 U/L   Total Bilirubin 0.5 0.3 - 1.2 mg/dL   Bilirubin, Direct <0.1 0.0 - 0.2 mg/dL   Indirect Bilirubin NOT CALCULATED 0.3 - 0.9 mg/dL  Troponin I (High Sensitivity)  Result Value Ref Range   Troponin I (High Sensitivity) 4 <18 ng/L  Troponin I (High Sensitivity)  Result Value Ref Range   Troponin I (High Sensitivity) 5 <18 ng/L        Assessment & Plan:      ICD-10-CM   1. Atypical chest pain  R07.89   2. Gastroesophageal reflux disease, unspecified whether esophagitis present  K21.9   3. History of esophageal dilatation  Z98.890     Encouraged pt to start protonix daily in am for the next 1-2 months, can use pepcid and mylanta PRN for sx, follow up with GI. Given info about GERD lifestyle changes and avoiding triggers.  With improving sx pt was confused if she should still see GI.  I explained that was up to her, but with her hx of esophageal dilatation and some trouble with secretions and swallowing when laying down it would be good to have a GI consult and est with GI.     Spend more than 20 min in person in exam room with the pt today reviewing her sx, ER visit, results, examining her and going over medications and plan.  Multiple questions asked and answered.    More than 42min + spent today on encounter with remainder of time involved but was not  limited to reviewing chart (recent and pertinent OV notes and labs), documentation in EMR, and coordinating care and treatment plan.   Delsa Grana, PA-C 12/12/19 9:47 AM

## 2019-12-12 NOTE — Patient Instructions (Addendum)
Start protonix in the morning daily for the next 1-2 months and follow up with GI  You can continue pepcid twice a day as needed   Food Choices for Gastroesophageal Reflux Disease, Adult When you have gastroesophageal reflux disease (GERD), the foods you eat and your eating habits are very important. Choosing the right foods can help ease your discomfort. Think about working with a nutrition specialist (dietitian) to help you make good choices. What are tips for following this plan?  Meals  Choose healthy foods that are low in fat, such as fruits, vegetables, whole grains, low-fat dairy products, and lean meat, fish, and poultry.  Eat small meals often instead of 3 large meals a day. Eat your meals slowly, and in a place where you are relaxed. Avoid bending over or lying down until 2-3 hours after eating.  Avoid eating meals 2-3 hours before bed.  Avoid drinking a lot of liquid with meals.  Cook foods using methods other than frying. Bake, grill, or broil food instead.  Avoid or limit: ? Chocolate. ? Peppermint or spearmint. ? Alcohol. ? Pepper. ? Black and decaffeinated coffee. ? Black and decaffeinated tea. ? Bubbly (carbonated) soft drinks. ? Caffeinated energy drinks and soft drinks.  Limit high-fat foods such as: ? Fatty meat or fried foods. ? Whole milk, cream, butter, or ice cream. ? Nuts and nut butters. ? Pastries, donuts, and sweets made with butter or shortening.  Avoid foods that cause symptoms. These foods may be different for everyone. Common foods that cause symptoms include: ? Tomatoes. ? Oranges, lemons, and limes. ? Peppers. ? Spicy food. ? Onions and garlic. ? Vinegar. Lifestyle  Maintain a healthy weight. Ask your doctor what weight is healthy for you. If you need to lose weight, work with your doctor to do so safely.  Exercise for at least 30 minutes for 5 or more days each week, or as told by your doctor.  Wear loose-fitting clothes.  Do not  smoke. If you need help quitting, ask your doctor.  Sleep with the head of your bed higher than your feet. Use a wedge under the mattress or blocks under the bed frame to raise the head of the bed. Summary  When you have gastroesophageal reflux disease (GERD), food and lifestyle choices are very important in easing your symptoms.  Eat small meals often instead of 3 large meals a day. Eat your meals slowly, and in a place where you are relaxed.  Limit high-fat foods such as fatty meat or fried foods.  Avoid bending over or lying down until 2-3 hours after eating.  Avoid peppermint and spearmint, caffeine, alcohol, and chocolate. This information is not intended to replace advice given to you by your health care provider. Make sure you discuss any questions you have with your health care provider. Document Revised: 11/21/2018 Document Reviewed: 09/05/2016 Elsevier Patient Education  [place].

## 2019-12-15 DIAGNOSIS — M25611 Stiffness of right shoulder, not elsewhere classified: Secondary | ICD-10-CM | POA: Diagnosis not present

## 2019-12-15 DIAGNOSIS — M25511 Pain in right shoulder: Secondary | ICD-10-CM | POA: Diagnosis not present

## 2019-12-16 DIAGNOSIS — M25511 Pain in right shoulder: Secondary | ICD-10-CM | POA: Diagnosis not present

## 2020-01-16 ENCOUNTER — Encounter: Payer: Self-pay | Admitting: Family Medicine

## 2020-01-16 ENCOUNTER — Ambulatory Visit (INDEPENDENT_AMBULATORY_CARE_PROVIDER_SITE_OTHER): Payer: Medicare HMO | Admitting: Family Medicine

## 2020-01-16 ENCOUNTER — Other Ambulatory Visit: Payer: Self-pay

## 2020-01-16 VITALS — BP 132/84 | HR 75 | Temp 98.1°F | Resp 14 | Ht 62.0 in | Wt 144.7 lb

## 2020-01-16 DIAGNOSIS — K219 Gastro-esophageal reflux disease without esophagitis: Secondary | ICD-10-CM | POA: Diagnosis not present

## 2020-01-16 DIAGNOSIS — Z9889 Other specified postprocedural states: Secondary | ICD-10-CM

## 2020-01-16 DIAGNOSIS — R0789 Other chest pain: Secondary | ICD-10-CM

## 2020-01-16 NOTE — Progress Notes (Signed)
Patient ID: Carmen Clarke, female    DOB: [birth year], 69 y.o.   MRN: [id number]  PCP: Delsa Grana, PA-C  Chief Complaint  Patient presents with  . Follow-up  . Hypertension  . Hyperlipidemia    Subjective:   Carmen Clarke is a 69 y.o. female, presents to clinic with CC of the following:  HPI  F/up on gerd/atypical CP, she went to the ER 4/22, f/up here 4/30 and she started protonix and continued her pepcid twice a day. She has improved pain and reflux sx. She had choking when she layed flat that did improve as well She has upcoming appt with GI, she asks about stopping or canceling this appointment but with her past medical history I did encourage her to keep her appointment to reestablish with GI  Patient Active Problem List   Diagnosis Date Noted  . Bursitis of hip 08/30/2018  . Full code status 01/11/2018  . Fracture of patella, left, closed 03/28/2017  . Medication monitoring encounter 11/09/2015  . Arm pain, left 09/28/2015  . Shoulder pain, left 09/28/2015  . Preventative health care 05/18/2015  . Overweight (BMI 25.0-29.9) 05/14/2015  . Breast cancer screening 05/14/2015  . Hypertension   . Hyperlipidemia   . Osteopenia   . Vitamin D deficiency disease       Current Outpatient Medications:  .  aspirin EC 81 MG tablet, Take 81 mg by mouth daily., Disp: , Rfl:  .  Calcium Carbonate-Vit D-Min (CALCIUM 1200) 1200-1000 MG-UNIT CHEW, Chew 1 capsule by mouth daily., Disp: , Rfl:  .  lisinopril-hydrochlorothiazide (ZESTORETIC) 10-12.5 MG tablet, Take 1 tablet by mouth daily., Disp: 90 tablet, Rfl: 3 .  pantoprazole (PROTONIX) 40 MG tablet, Take 1 tablet (40 mg total) by mouth daily., Disp: 60 tablet, Rfl: 1 .  simvastatin (ZOCOR) 20 MG tablet, Take 1 tablet (20 mg total) by mouth at bedtime., Disp: 90 tablet, Rfl: 3   No Known Allergies   Family History  Problem Relation Age of Onset  . Diabetes Mother   . Hypertension Mother   . Hypertension Father    . Cancer Neg Hx   . COPD Neg Hx   . Heart disease Neg Hx   . Stroke Neg Hx      Social History   Socioeconomic History  . Marital status: Married    Spouse name: Not on file  . Number of children: 1  . Years of education: Not on file  . Highest education level: High school graduate  Occupational History  . Occupation: retired  Tobacco Use  . Smoking status: Never Smoker  . Smokeless tobacco: Never Used  Substance and Sexual Activity  . Alcohol use: No    Alcohol/week: 0.0 standard drinks  . Drug use: No  . Sexual activity: Yes  Other Topics Concern  . Not on file  Social History Narrative  . Not on file   Social Determinants of Health   Financial Resource Strain:   . Difficulty of Paying Living Expenses:   Food Insecurity:   . Worried About Charity fundraiser in the Last Year:   . Arboriculturist in the Last Year:   Transportation Needs:   . Film/video editor (Medical):   Marland Kitchen Lack of Transportation (Non-Medical):   Physical Activity: Insufficiently Active  . Days of Exercise per Week: 2 days  . Minutes of Exercise per Session: 30 min  Stress: No Stress Concern Present  . Feeling of Stress :  Only a little  Social Connections: Slightly Isolated  . Frequency of Communication with Friends and Family: More than three times a week  . Frequency of Social Gatherings with Friends and Family: More than three times a week  . Attends Religious Services: More than 4 times per year  . Active Member of Clubs or Organizations: No  . Attends Archivist Meetings: Never  . Marital Status: Married  Human resources officer Violence: Not At Risk  . Fear of Current or Ex-Partner: No  . Emotionally Abused: No  . Physically Abused: No  . Sexually Abused: No    Chart Review Today: I personally reviewed active problem list, medication list, allergies, family history, social history, health maintenance, notes from last encounter, lab results, imaging with the  patient/caregiver today.   Review of Systems 10 Systems reviewed and are negative for acute change except as noted in the HPI.     Objective:   Vitals:   01/16/20 1026  BP: 132/84  Pulse: 75  Resp: 14  Temp: 98.1 F (36.7 C)  SpO2: 96%  Weight: 144 lb 11.2 oz (65.6 kg)  Height: 5\' 2"  (1.575 m)    Body mass index is 26.47 kg/m.  Physical Exam Vitals and nursing note reviewed.  Constitutional:      General: She is not in acute distress.    Appearance: Normal appearance. She is well-developed. She is not ill-appearing, toxic-appearing or diaphoretic.  HENT:     Head: Normocephalic and atraumatic.     Nose: Nose normal.  Eyes:     General:        Right eye: No discharge.        Left eye: No discharge.     Conjunctiva/sclera: Conjunctivae normal.  Neck:     Trachea: No tracheal deviation.  Cardiovascular:     Rate and Rhythm: Normal rate and regular rhythm.     Pulses: Normal pulses.     Heart sounds: Normal heart sounds.  Pulmonary:     Effort: Pulmonary effort is normal. No respiratory distress.     Breath sounds: Normal breath sounds. No stridor.  Abdominal:     General: Bowel sounds are normal. There is no distension.     Palpations: Abdomen is soft.     Tenderness: There is no abdominal tenderness.  Musculoskeletal:        General: Normal range of motion.  Skin:    General: Skin is warm and dry.     Findings: No rash.  Neurological:     Mental Status: She is alert.     Motor: No abnormal muscle tone.     Coordination: Coordination normal.  Psychiatric:        Mood and Affect: Mood normal.        Behavior: Behavior normal.         Results for orders placed or performed during the hospital encounter of 11/94/17  Basic metabolic panel  Result Value Ref Range   Sodium 139 135 - 145 mmol/L   Potassium 4.2 3.5 - 5.1 mmol/L   Chloride 103 98 - 111 mmol/L   CO2 27 22 - 32 mmol/L   Glucose, Bld 117 (H) 70 - 99 mg/dL   BUN 27 (H) 8 - 23 mg/dL    Creatinine, Ser 0.73 0.44 - 1.00 mg/dL   Calcium 9.3 8.9 - 10.3 mg/dL   GFR calc non Af Amer >60 >60 mL/min   GFR calc Af Amer >60 >60 mL/min   Anion  gap 9 5 - 15  CBC  Result Value Ref Range   WBC 8.3 4.0 - 10.5 K/uL   RBC 4.46 3.87 - 5.11 MIL/uL   Hemoglobin 13.9 12.0 - 15.0 g/dL   HCT 41.1 36.0 - 46.0 %   MCV 92.2 80.0 - 100.0 fL   MCH 31.2 26.0 - 34.0 pg   MCHC 33.8 30.0 - 36.0 g/dL   RDW 12.0 11.5 - 15.5 %   Platelets 316 150 - 400 K/uL   nRBC 0.0 0.0 - 0.2 %  Urinalysis, Complete w Microscopic  Result Value Ref Range   Color, Urine YELLOW (A) YELLOW   APPearance CLEAR (A) CLEAR   Specific Gravity, Urine 1.025 1.005 - 1.030   pH 5.0 5.0 - 8.0   Glucose, UA NEGATIVE NEGATIVE mg/dL   Hgb urine dipstick NEGATIVE NEGATIVE   Bilirubin Urine NEGATIVE NEGATIVE   Ketones, ur NEGATIVE NEGATIVE mg/dL   Protein, ur NEGATIVE NEGATIVE mg/dL   Nitrite NEGATIVE NEGATIVE   Leukocytes,Ua NEGATIVE NEGATIVE   RBC / HPF 0-5 0 - 5 RBC/hpf   WBC, UA 0-5 0 - 5 WBC/hpf   Bacteria, UA NONE SEEN NONE SEEN   Squamous Epithelial / LPF 0-5 0 - 5   Mucus PRESENT   Lipase, blood  Result Value Ref Range   Lipase 28 11 - 51 U/L  Hepatic function panel  Result Value Ref Range   Total Protein 7.4 6.5 - 8.1 g/dL   Albumin 4.6 3.5 - 5.0 g/dL   AST 21 15 - 41 U/L   ALT 21 0 - 44 U/L   Alkaline Phosphatase 80 38 - 126 U/L   Total Bilirubin 0.5 0.3 - 1.2 mg/dL   Bilirubin, Direct <0.1 0.0 - 0.2 mg/dL   Indirect Bilirubin NOT CALCULATED 0.3 - 0.9 mg/dL  Troponin I (High Sensitivity)  Result Value Ref Range   Troponin I (High Sensitivity) 4 <18 ng/L  Troponin I (High Sensitivity)  Result Value Ref Range   Troponin I (High Sensitivity) 5 <18 ng/L        Assessment & Plan:      ICD-10-CM   1. History of esophageal dilatation  Z98.890    Encouraged her to keep GI appointment to establish locally with her past medical history she notes improved GI symptoms and less sensation of choking  2.  Gastroesophageal reflux disease, unspecified whether esophagitis present  K21.9    Patient has only used Protonix a few times, her symptoms have improved, encouraged her to follow-up with GI  3. Atypical chest pain  R07.89    Improved symptoms no recurrence, suspect GI etiology        Delsa Grana, PA-C 01/16/20 10:51 AM

## 2020-01-19 ENCOUNTER — Encounter: Payer: Self-pay | Admitting: Gastroenterology

## 2020-01-19 ENCOUNTER — Other Ambulatory Visit: Payer: Self-pay

## 2020-01-19 ENCOUNTER — Ambulatory Visit: Payer: Medicare HMO | Admitting: Gastroenterology

## 2020-01-19 VITALS — BP 123/74 | HR 80 | Temp 98.5°F | Ht 62.0 in | Wt 143.8 lb

## 2020-01-19 DIAGNOSIS — K219 Gastro-esophageal reflux disease without esophagitis: Secondary | ICD-10-CM

## 2020-01-19 MED ORDER — PANTOPRAZOLE SODIUM 40 MG PO TBEC: 40.0000 mg | DELAYED_RELEASE_TABLET | Freq: Every day | ORAL | 0 refills | Status: DC

## 2020-01-19 NOTE — Patient Instructions (Signed)
Please discontinue taking Pepcid.  Please buy a pillow wedge to help you with your acid reflux.  We will see you back in 6 months.

## 2020-01-21 NOTE — Progress Notes (Signed)
Vonda Antigua [address]  [place], [place] [address]  Main: [phone]  Fax: [phone]   Gastroenterology Consultation  Referring Provider:     Delsa Grana, PA-C Primary Care Physician:  Delsa Grana, PA-C Reason for Consultation: Reflux        HPI:    Chief Complaint  Patient presents with  . New Patient (Initial Visit)  . Gastroesophageal Reflux    Patient stated that she had been doing well except one time that she had an episode of chest pain.    JAKYLAH BASSINGER is a 69 y.o. y/o female referred for consultation & management  by Dr. Delsa Grana, PA-C.  Patient reports symptoms of water brash, especially when being down.  Otherwise she is asymptomatic during the day.  No dysphagia.  No nausea or vomiting, abdominal pain.  No altered bowel habits or blood in stool.  Is on H2 RA but not on any PPI.  Past Medical History:  Diagnosis Date  . Fracture of patella, left, closed 03/28/2017   2018  . Hyperlipidemia   . Hypertension   . Menopausal symptoms    previous HRT  . Osteopenia   . Vitamin D deficiency disease     Past Surgical History:  Procedure Laterality Date  . COLONOSCOPY  2014  . FEMUR FRACTURE SURGERY    . FOOT SURGERY  Aug. 2016  . HIP FRACTURE SURGERY    . knee arthoscopy Left    partial medial and lateral meniscetomy with removal of deep hardware from left distal femur    Prior to Admission medications   Medication Sig Start Date End Date Taking? Authorizing Provider  aspirin EC 81 MG tablet Take 81 mg by mouth daily.   Yes [provider]  Calcium Carbonate-Vit D-Min (CALCIUM 1200) 1200-1000 MG-UNIT CHEW Chew 1 capsule by mouth daily.   Yes [provider]  famotidine (PEPCID) 20 MG tablet Take 20 mg by mouth 2 (two) times daily.   Yes [provider]  lisinopril-hydrochlorothiazide (ZESTORETIC) 10-12.5 MG tablet Take 1 tablet by mouth daily. 07/18/19  Yes Delsa Grana, PA-C  pantoprazole  (PROTONIX) 40 MG tablet Take 1 tablet (40 mg total) by mouth daily. 01/19/20  Yes Virgel Manifold, MD  simvastatin (ZOCOR) 20 MG tablet Take 1 tablet (20 mg total) by mouth at bedtime. 01/17/19  Yes Hubbard Hartshorn, FNP    Family History  Problem Relation Age of Onset  . Diabetes Mother   . Hypertension Mother   . Hypertension Father   . Cancer Neg Hx   . COPD Neg Hx   . Heart disease Neg Hx   . Stroke Neg Hx      Social History   Tobacco Use  . Smoking status: Never Smoker  . Smokeless tobacco: Never Used  Substance Use Topics  . Alcohol use: No    Alcohol/week: 0.0 standard drinks  . Drug use: No    Allergies as of 01/19/2020  . (No Known Allergies)    Review of Systems:    All systems reviewed and negative except where noted in HPI.   Physical Exam:  BP 123/74   Pulse 80   Temp 98.5 F (36.9 C) (Oral)   Ht 5\' 2"  (1.575 m)   Wt 143 lb 12.8 oz (65.2 kg)   BMI 26.30 kg/m  No LMP recorded. Patient is postmenopausal. Psych:  Alert and cooperative. Normal mood and affect. General:   Alert,  Well-developed, well-nourished,  pleasant and cooperative in NAD Head:  Normocephalic and atraumatic. Eyes:  Sclera clear, no icterus.   Conjunctiva pink. Ears:  Normal auditory acuity. Nose:  No deformity, discharge, or lesions. Mouth:  No deformity or lesions,oropharynx pink & moist. Neck:  Supple; no masses or thyromegaly. Abdomen:  Normal bowel sounds.  No bruits.  Soft, non-tender and non-distended without masses, hepatosplenomegaly or hernias noted.  No guarding or rebound tenderness.    Msk:  Symmetrical without gross deformities. Good, equal movement & strength bilaterally. Pulses:  Normal pulses noted. Extremities:  No clubbing or edema.  No cyanosis. Neurologic:  Alert and oriented x3;  grossly normal neurologically. Skin:  Intact without significant lesions or rashes. No jaundice. Lymph Nodes:  No significant cervical adenopathy. Psych:  Alert and cooperative.  Normal mood and affect.   Labs: CBC    Component Value Date/Time   WBC 8.3 12/03/2019 2146   RBC 4.46 12/03/2019 2146   HGB 13.9 12/03/2019 2146   HGB 13.5 05/14/2015 0949   HCT 41.1 12/03/2019 2146   HCT 40.6 05/14/2015 0949   PLT 316 12/03/2019 2146   PLT 320 05/14/2015 0949   MCV 92.2 12/03/2019 2146   MCV 96 05/14/2015 0949   MCH 31.2 12/03/2019 2146   MCHC 33.8 12/03/2019 2146   RDW 12.0 12/03/2019 2146   RDW 12.6 05/14/2015 0949   LYMPHSABS 2.5 05/14/2015 0949   EOSABS 0.3 05/14/2015 0949   BASOSABS 0.1 05/14/2015 0949   CMP     Component Value Date/Time   NA 139 12/03/2019 2146   NA 141 05/14/2015 0949   K 4.2 12/03/2019 2146   CL 103 12/03/2019 2146   CO2 27 12/03/2019 2146   GLUCOSE 117 (H) 12/03/2019 2146   BUN 27 (H) 12/03/2019 2146   BUN 22 05/14/2015 0949   CREATININE 0.73 12/03/2019 2146   CREATININE 0.90 07/18/2019 0000   CALCIUM 9.3 12/03/2019 2146   PROT 7.4 12/04/2019 0011   PROT 6.7 05/14/2015 0949   ALBUMIN 4.6 12/04/2019 0011   ALBUMIN 4.5 05/14/2015 0949   AST 21 12/04/2019 0011   ALT 21 12/04/2019 0011   ALKPHOS 80 12/04/2019 0011   BILITOT 0.5 12/04/2019 0011   BILITOT 0.3 05/14/2015 0949   GFRNONAA >60 12/03/2019 2146   GFRNONAA 66 07/18/2019 0000   GFRAA >60 12/03/2019 2146   GFRAA 76 07/18/2019 0000    Imaging Studies: No results found.  Assessment and Plan:   KANIA REGNIER is a 69 y.o. y/o female has been referred for reflux  Patient's main symptom is water brash when laying down, otherwise asymptomatic  Start PPI once daily  Patient educated extensively on acid reflux lifestyle modification, including buying a bed wedge, not eating 3 hrs before bedtime, diet modifications, and handout given for the same.   Given that her main symptom is only when laying down, I encouraged her to sleep on a reclining surface or get a bed wedge  (Risks of PPI use were discussed with patient including bone loss, C. Diff diarrhea,  pneumonia, infections, CKD, electrolyte abnormalities.  Pt. Verbalizes understanding and chooses to continue the medication.)  No alarm symptoms of her dysphagia present to indicate urgent endoscopy at this time  If symptoms do not improve, patient asked to notify us  Dr Vonda Antigua  Speech recognition software was used to dictate the above note.

## 2020-01-26 DIAGNOSIS — M25511 Pain in right shoulder: Secondary | ICD-10-CM | POA: Diagnosis not present

## 2020-01-27 ENCOUNTER — Telehealth: Payer: Self-pay | Admitting: Family Medicine

## 2020-01-27 DIAGNOSIS — M75121 Complete rotator cuff tear or rupture of right shoulder, not specified as traumatic: Secondary | ICD-10-CM | POA: Diagnosis not present

## 2020-01-27 NOTE — Chronic Care Management (AMB) (Signed)
  Chronic Care Management   Note  01/27/2020 Name: Carmen Clarke MRN: [id number] DOB: November 20, 1950  Carmen Clarke is a 69 y.o. year old female who is a primary care patient of Delsa Grana, [place]. I reached out to Thornell Sartorius by phone today in response to a referral sent by Ms. [employer] health plan.     [name] was given information about Chronic Care Management services today including:  1. CCM service includes personalized support from designated clinical staff supervised by her physician, including individualized plan of care and coordination with other care providers 2. 24/7 contact phone numbers for assistance for urgent and routine care needs. 3. Service will only be billed when office clinical staff spend 20 minutes or more in a month to coordinate care. 4. Only one practitioner may furnish and bill the service in a calendar month. 5. The patient may stop CCM services at any time (effective at the end of the month) by phone call to the office staff. 6. The patient will be responsible for cost sharing (co-pay) of up to 20% of the service fee (after annual deductible is met).  Patient agreed to services and verbal consent obtained.   Follow up plan: Telephone appointment with care management team member scheduled for:02/23/2020.  [place], [place] [address] Direct Dial: [phone] [email or web address]

## 2020-02-23 ENCOUNTER — Ambulatory Visit (INDEPENDENT_AMBULATORY_CARE_PROVIDER_SITE_OTHER): Payer: Medicare HMO

## 2020-02-23 DIAGNOSIS — E785 Hyperlipidemia, unspecified: Secondary | ICD-10-CM

## 2020-02-23 DIAGNOSIS — I10 Essential (primary) hypertension: Secondary | ICD-10-CM | POA: Diagnosis not present

## 2020-02-23 NOTE — Chronic Care Management (AMB) (Signed)
Chronic Care Management   Initial Visit Note  02/23/2020 Name: Carmen Clarke MRN: [id number] DOB: 1951-01-03  Primary Care Provider: Delsa Grana, PA-C Reason for referral : Chronic Care Management   TARSHA BLANDO is a 69 y.o. year old female who is a primary care patient of Delsa Grana, [place]. The CCM team was consulted for assistance with chronic disease management and care coordination. A telephonic assessment was conducted today.  Review of Mrs. Michaelis's status, including review of consultants reports, relevant labs and test results was conducted today. Collaboration with appropriate care team members was performed as part of the comprehensive evaluation and provision of chronic care management services.    SDOH (Social Determinants of Health) assessments performed: Yes  SDOH Interventions     Most Recent Value  SDOH Interventions  Food Insecurity Interventions Intervention Not Indicated  Transportation Interventions Intervention Not Indicated        Medications: Outpatient Encounter Medications as of 02/23/2020  Medication Sig  . aspirin EC 81 MG tablet Take 81 mg by mouth daily.  . Calcium Carbonate-Vit D-Min (CALCIUM 1200) 1200-1000 MG-UNIT CHEW Chew 1 capsule by mouth daily.  Marland Kitchen lisinopril-hydrochlorothiazide (ZESTORETIC) 10-12.5 MG tablet Take 1 tablet by mouth daily.  . simvastatin (ZOCOR) 20 MG tablet Take 1 tablet (20 mg total) by mouth at bedtime.  . famotidine (PEPCID) 20 MG tablet Take 20 mg by mouth 2 (two) times daily. (Patient not taking: Reported on 02/23/2020)  . pantoprazole (PROTONIX) 40 MG tablet Take 1 tablet (40 mg total) by mouth daily. (Patient not taking: Reported on 02/23/2020)   No facility-administered encounter medications on file as of 02/23/2020.     Objective:   BP Readings from Last 3 Encounters:  01/19/20 123/74  01/16/20 132/84  12/12/19 124/82    Lab Results  Component Value Date   CHOL 180 07/18/2019   HDL 78 07/18/2019    LDLCALC 78 07/18/2019   LDLDIRECT 74 04/14/2016   TRIG 141 07/18/2019   CHOLHDL 2.3 07/18/2019    Goals Addressed            This Visit's Progress   . Chronic Care Management       CARE PLAN ENTRY (see longitudinal plan of care for additional care plan information)  Current Barriers:  . Chronic Disease Management support and education needs related to Hypertension and Hyperlipidemia.  Case Manager Clinical Goal(s):  Over the next 30 days, patient will: . Not experience complications following Rotator Cuff repair. Over the next 120 days, patient will: . Take all medications as prescribed. . Attend all medical appointments as scheduled. . Continue adhering to recommended cardiac prudent/heart healthy diet. . Monitor blood pressure and record readings. . Follow recommended safety measures to prevent falls and injuries.    Interventions:  . Inter-disciplinary care team collaboration (see longitudinal plan of care) . Reviewed medications. Encouraged to continue taking as prescribed and notify provider if unable to tolerate prescribed regimen. Encouraged to inform care management team with concerns regarding prescription cost. . Discussed current blood pressure readings. Reviewed established ranges and indications for notifying her provider. Reports readings have been within range. . Discussed current nutritional intake. Encouraged to continue compliance with recommended cardiac prudent/heart healthy diet. . Discussed safety precautions. Encouraged to follow Ortho providers recommendations and activity restrictions to prevent further injury to right shoulder. . Reviewed pending appointments. Encouraged to attend as scheduled to prevent delays in care. Denies concerns regarding transportation. . Discussed plans for ongoing care management and follow-up. Provided  direct contact information for care management team. Pending surgical procedure r/t rotator cuff repair on 02/25/20. Discussed  possible post-op needs. Anticipates starting physical therapy on 03/03/20. Reports spouse will be available to assist in the home as needed. Denies immediate needs. Encouraged to notify care management team if health status changes.  Patient Self Care Activities:  . Self administers medications  . Attends scheduled provider appointments . Calls pharmacy for medication refills . Performs ADL's independently . Performs IADL's independently . Calls provider office for new concerns or questions   Initial goal documentation           Ms. Buntrock was given information about Chronic Care Management services including:  1. CCM service includes personalized support from designated clinical staff supervised by her physician, including individualized plan of care and coordination with other care providers 2. 24/7 contact phone numbers for assistance for urgent and routine care needs. 3. Service will only be billed when office clinical staff spend 20 minutes or more in a month to coordinate care. 4. Only one practitioner may furnish and bill the service in a calendar month. 5. The patient may stop CCM services at any time (effective at the end of the month) by phone call to the office staff. 6. The patient will be responsible for cost sharing (co-pay) of up to 20% of the service fee (after annual deductible is met).  Patient agreed to services and verbal consent obtained.    PLAN The care management team will follow-up with Mrs. Teagle later this month.   [facility] Care [facility] [phone]

## 2020-02-25 DIAGNOSIS — M7521 Bicipital tendinitis, right shoulder: Secondary | ICD-10-CM | POA: Diagnosis not present

## 2020-02-25 DIAGNOSIS — E7800 Pure hypercholesterolemia, unspecified: Secondary | ICD-10-CM | POA: Diagnosis not present

## 2020-02-25 DIAGNOSIS — M24111 Other articular cartilage disorders, right shoulder: Secondary | ICD-10-CM | POA: Diagnosis not present

## 2020-02-25 DIAGNOSIS — M19011 Primary osteoarthritis, right shoulder: Secondary | ICD-10-CM | POA: Diagnosis not present

## 2020-02-25 DIAGNOSIS — M25511 Pain in right shoulder: Secondary | ICD-10-CM | POA: Diagnosis not present

## 2020-02-25 DIAGNOSIS — M65811 Other synovitis and tenosynovitis, right shoulder: Secondary | ICD-10-CM | POA: Diagnosis not present

## 2020-02-25 DIAGNOSIS — I10 Essential (primary) hypertension: Secondary | ICD-10-CM | POA: Diagnosis not present

## 2020-02-25 DIAGNOSIS — M7551 Bursitis of right shoulder: Secondary | ICD-10-CM | POA: Diagnosis not present

## 2020-02-25 DIAGNOSIS — M75121 Complete rotator cuff tear or rupture of right shoulder, not specified as traumatic: Secondary | ICD-10-CM | POA: Diagnosis not present

## 2020-02-25 DIAGNOSIS — M66311 Spontaneous rupture of flexor tendons, right shoulder: Secondary | ICD-10-CM | POA: Diagnosis not present

## 2020-02-25 DIAGNOSIS — M13811 Other specified arthritis, right shoulder: Secondary | ICD-10-CM | POA: Diagnosis not present

## 2020-02-25 DIAGNOSIS — M7541 Impingement syndrome of right shoulder: Secondary | ICD-10-CM | POA: Diagnosis not present

## 2020-02-25 DIAGNOSIS — G8918 Other acute postprocedural pain: Secondary | ICD-10-CM | POA: Diagnosis not present

## 2020-02-27 NOTE — Patient Instructions (Signed)
Thank you for allowing the Chronic Care Management team to participate in your care.    Goals Addressed            This Visit's Progress    Chronic Care Management       CARE PLAN ENTRY (see longitudinal plan of care for additional care plan information)  Current Barriers:   Chronic Disease Management support and education needs related to Hypertension and Hyperlipidemia.  Case Manager Clinical Goal(s):  Over the next 30 days, patient will:  Not experience complications following Rotator Cuff repair. Over the next 120 days, patient will:  Take all medications as prescribed.  Attend all medical appointments as scheduled.  Continue adhering to recommended cardiac prudent/heart healthy diet.  Monitor blood pressure and record readings.  Follow recommended safety measures to prevent falls and injuries.    Interventions:   Inter-disciplinary care team collaboration (see longitudinal plan of care)  Reviewed medications. Encouraged to continue taking as prescribed and notify provider if unable to tolerate prescribed regimen. Encouraged to inform care management team with concerns regarding prescription cost.  Discussed current blood pressure readings. Reviewed established ranges and indications for notifying her provider. Reports readings have been within range.  Discussed current nutritional intake. Encouraged to continue compliance with recommended cardiac prudent/heart healthy diet.  Discussed safety precautions. Encouraged to follow Ortho providers recommendations and activity restrictions to prevent further injury to right shoulder.  Reviewed pending appointments. Encouraged to attend as scheduled to prevent delays in care. Denies concerns regarding transportation.  Discussed plans for ongoing care management and follow-up. Provided direct contact information for care management team. Pending surgical procedure r/t rotator cuff repair on 02/25/20. Discussed possible  post-op needs. Anticipates starting physical therapy on 03/03/20. Reports spouse will be available to assist in the home as needed. Denies immediate needs. Encouraged to notify care management team if health status changes.  Patient Self Care Activities:   Self administers medications   Attends scheduled provider appointments  Calls pharmacy for medication refills  Performs ADL's independently  Performs IADL's independently  Calls provider office for new concerns or questions   Initial goal documentation        Ms. Pinela was given information about Chronic Care Management services including:  1. CCM service includes personalized support from designated clinical staff supervised by her physician, including individualized plan of care and coordination with other care providers 2. 24/7 contact phone numbers for assistance for urgent and routine care needs. 3. Service will only be billed when office clinical staff spend 20 minutes or more in a month to coordinate care. 4. Only one practitioner may furnish and bill the service in a calendar month. 5. The patient may stop CCM services at any time (effective at the end of the month) by phone call to the office staff. 6. The patient will be responsible for cost sharing (co-pay) of up to 20% of the service fee (after annual deductible is met).  Patient agreed to services and verbal consent obtained.    Mrs. Custodio verbalized understanding of the information discussed during the telephonic outreach today. Declined need for a printed/mailed copy of the instructions.   The care management team will follow-up with Mrs. Ilagan later this month.   [facility] Care [facility] [phone]

## 2020-03-03 DIAGNOSIS — M25511 Pain in right shoulder: Secondary | ICD-10-CM | POA: Diagnosis not present

## 2020-03-03 DIAGNOSIS — M25611 Stiffness of right shoulder, not elsewhere classified: Secondary | ICD-10-CM | POA: Diagnosis not present

## 2020-03-05 DIAGNOSIS — M25611 Stiffness of right shoulder, not elsewhere classified: Secondary | ICD-10-CM | POA: Diagnosis not present

## 2020-03-05 DIAGNOSIS — R600 Localized edema: Secondary | ICD-10-CM | POA: Diagnosis not present

## 2020-03-05 DIAGNOSIS — M25511 Pain in right shoulder: Secondary | ICD-10-CM | POA: Diagnosis not present

## 2020-03-09 DIAGNOSIS — M25511 Pain in right shoulder: Secondary | ICD-10-CM | POA: Diagnosis not present

## 2020-03-09 DIAGNOSIS — M25611 Stiffness of right shoulder, not elsewhere classified: Secondary | ICD-10-CM | POA: Diagnosis not present

## 2020-03-10 ENCOUNTER — Ambulatory Visit: Payer: Self-pay

## 2020-03-10 DIAGNOSIS — E785 Hyperlipidemia, unspecified: Secondary | ICD-10-CM

## 2020-03-10 DIAGNOSIS — I10 Essential (primary) hypertension: Secondary | ICD-10-CM

## 2020-03-10 NOTE — Chronic Care Management (AMB) (Signed)
Chronic Care Management   Follow Up Note   03/10/2020 Name: Carmen Clarke MRN: [id number] DOB: Nov 01, 1950  Primary Care Provider: Delsa Grana, PA-C Reason for referral : Chronic Care Management   Carmen Clarke is a 69 y.o. year old female who is a primary care patient of Delsa Grana, [place]. She is currently engaged with the chronic care management team. A routine outreach was conducted today.   Review of Carmen Clarke's status, including review of consultants reports, relevant labs and test results was conducted today. Collaboration with appropriate care team members was performed as part of the comprehensive evaluation and provision of chronic care management services.    SDOH (Social Determinants of Health) assessments performed: No    Outpatient Encounter Medications as of 03/10/2020  Medication Sig  . aspirin EC 81 MG tablet Take 81 mg by mouth daily.  . Calcium Carbonate-Vit D-Min (CALCIUM 1200) 1200-1000 MG-UNIT CHEW Chew 1 capsule by mouth daily.  . famotidine (PEPCID) 20 MG tablet Take 20 mg by mouth 2 (two) times daily. (Patient not taking: Reported on 02/23/2020)  . lisinopril-hydrochlorothiazide (ZESTORETIC) 10-12.5 MG tablet Take 1 tablet by mouth daily.  . pantoprazole (PROTONIX) 40 MG tablet Take 1 tablet (40 mg total) by mouth daily. (Patient not taking: Reported on 02/23/2020)  . simvastatin (ZOCOR) 20 MG tablet Take 1 tablet (20 mg total) by mouth at bedtime.   No facility-administered encounter medications on file as of 03/10/2020.       Goals Addressed            This Visit's Progress   . Chronic Disease Management       CARE PLAN ENTRY (see longitudinal plan of care for additional care plan information)  Current Barriers:  . Chronic Disease Management support and education needs related to Hypertension and Hyperlipidemia.  Case Manager Clinical Goal(s):  Over the next 30 days, patient will: . Not experience complications following Rotator Cuff  repair. Over the next 120 days, patient will: . Take all medications as prescribed. . Attend all medical appointments as scheduled. . Continue adhering to recommended cardiac prudent/heart healthy diet. . Monitor blood pressure and record readings. . Follow recommended safety measures to prevent falls and injuries.    Interventions:  . Inter-disciplinary care team collaboration (see longitudinal plan of care) . Discussed compliance with hypertension self-management.  Continues to take medications as prescribed. Good nutritional intake. Reports blood pressure readings have been within the established range with systolic readings in the 353'I.  . S/P Rotator Cuff Repair. Reviewed treatment recommendations, safety precautions and infection prevention measures. Reports doing well since surgical procedure. She experienced poor pain management during the initial post-op period. She tried hydrocodone then oxycodone with poor relief. Now taking tramadol with good pain relief. Encouraged to continue taking stool softener to prevent constipation. Surgical sutures were removed during her evaluation with the Ortho team on 03/09/20. Steri-strips in place. Denies drainage, discoloration or increased edema to the insertion sites. Discussed s/sx of infection along with indications for notifying the surgical team. She is wearing the prescribed arm sling and following the recommended activity restrictions. Currently attending physical therapy twice a week. She will follow up with Dr. Harlow Mares in September.   . Discussed plans for ongoing care management and follow-up. Doing well in the home with good spousal support. Denies immediate care management needs. Anticipates being out of the state from August 20th until September 11th. Her daughter and spouse will be available to assist as needed during this  time. The care management team will plan to follow-up prior to her departure in August.  Patient Self Care Activities:    . Self administers medications  . Attends scheduled provider appointments . Calls pharmacy for medication refills . Performs ADL's independently . Performs IADL's independently . Calls provider office for new concerns or questions   Please see past updates related to this goal by clicking on the "Past Updates" button in the selected goal         PLAN The care management team will follow-up with Carmen Clarke next month.    [facility] Care [facility] [phone]

## 2020-03-12 DIAGNOSIS — M25511 Pain in right shoulder: Secondary | ICD-10-CM | POA: Diagnosis not present

## 2020-03-12 DIAGNOSIS — M25611 Stiffness of right shoulder, not elsewhere classified: Secondary | ICD-10-CM | POA: Diagnosis not present

## 2020-03-16 DIAGNOSIS — M25611 Stiffness of right shoulder, not elsewhere classified: Secondary | ICD-10-CM | POA: Diagnosis not present

## 2020-03-16 DIAGNOSIS — M25511 Pain in right shoulder: Secondary | ICD-10-CM | POA: Diagnosis not present

## 2020-03-18 NOTE — Patient Instructions (Signed)
Thank you for allowing the Chronic Care Management team to participate in your care.   Goals Addressed            This Visit's Progress   . Chronic Disease Management       CARE PLAN ENTRY (see longitudinal plan of care for additional care plan information)  Current Barriers:  . Chronic Disease Management support and education needs related to Hypertension and Hyperlipidemia.  Case Manager Clinical Goal(s):  Over the next 30 days, patient will: . Not experience complications following Rotator Cuff repair. Over the next 120 days, patient will: . Take all medications as prescribed. . Attend all medical appointments as scheduled. . Continue adhering to recommended cardiac prudent/heart healthy diet. . Monitor blood pressure and record readings. . Follow recommended safety measures to prevent falls and injuries.    Interventions:  . Inter-disciplinary care team collaboration (see longitudinal plan of care) . Discussed compliance with hypertension self-management.  Continues to take medications as prescribed. Good nutritional intake. Reports blood pressure readings have been within the established range with systolic readings in the 786'V.  . S/P Rotator Cuff Repair. Reviewed treatment recommendations, safety precautions and infection prevention measures. Reports doing well since surgical procedure. She experienced poor pain management during the initial post-op period. She tried hydrocodone then oxycodone with poor relief. Now taking tramadol with good pain relief. Encouraged to continue taking stool softener to prevent constipation. Surgical sutures were removed during her evaluation with the Ortho team on 03/09/20. Steri-strips in place. Denies drainage, discoloration or increased edema to the insertion sites. Discussed s/sx of infection along with indications for notifying the surgical team. She is wearing the prescribed arm sling and following the recommended activity restrictions.  Currently attending physical therapy twice a week. She will follow up with Dr. Harlow Mares in September.   . Discussed plans for ongoing care management and follow-up. Doing well in the home with good spousal support. Denies immediate care management needs. Anticipates being out of the state from August 20th until September 11th. Her daughter and spouse will be available to assist as needed during this time. The care management team will plan to follow-up prior to her departure in August.  Patient Self Care Activities:  . Self administers medications  . Attends scheduled provider appointments . Calls pharmacy for medication refills . Performs ADL's independently . Performs IADL's independently . Calls provider office for new concerns or questions   Please see past updates related to this goal by clicking on the "Past Updates" button in the selected goal         Carmen Clarke verbalized understanding of the information discussed during the telephonic outreach today. Declined need for a mailed/printed copy of the instructions.   The care management team will follow-up with Carmen Clarke next month.    [facility] Care [facility] [phone]

## 2020-03-19 DIAGNOSIS — M25611 Stiffness of right shoulder, not elsewhere classified: Secondary | ICD-10-CM | POA: Diagnosis not present

## 2020-03-19 DIAGNOSIS — M25511 Pain in right shoulder: Secondary | ICD-10-CM | POA: Diagnosis not present

## 2020-03-23 DIAGNOSIS — M25611 Stiffness of right shoulder, not elsewhere classified: Secondary | ICD-10-CM | POA: Diagnosis not present

## 2020-03-23 DIAGNOSIS — M25511 Pain in right shoulder: Secondary | ICD-10-CM | POA: Diagnosis not present

## 2020-03-25 ENCOUNTER — Other Ambulatory Visit: Payer: Self-pay

## 2020-03-25 DIAGNOSIS — E782 Mixed hyperlipidemia: Secondary | ICD-10-CM

## 2020-03-25 MED ORDER — SIMVASTATIN 20 MG PO TABS: 20.0000 mg | ORAL_TABLET | Freq: Every day | ORAL | 3 refills | Status: AC

## 2020-03-26 DIAGNOSIS — M25611 Stiffness of right shoulder, not elsewhere classified: Secondary | ICD-10-CM | POA: Diagnosis not present

## 2020-03-26 DIAGNOSIS — M25511 Pain in right shoulder: Secondary | ICD-10-CM | POA: Diagnosis not present

## 2020-03-30 DIAGNOSIS — M25511 Pain in right shoulder: Secondary | ICD-10-CM | POA: Diagnosis not present

## 2020-03-30 DIAGNOSIS — M25611 Stiffness of right shoulder, not elsewhere classified: Secondary | ICD-10-CM | POA: Diagnosis not present

## 2020-04-01 DIAGNOSIS — M25511 Pain in right shoulder: Secondary | ICD-10-CM | POA: Diagnosis not present

## 2020-04-01 DIAGNOSIS — M25611 Stiffness of right shoulder, not elsewhere classified: Secondary | ICD-10-CM | POA: Diagnosis not present

## 2020-04-26 DIAGNOSIS — M25511 Pain in right shoulder: Secondary | ICD-10-CM | POA: Diagnosis not present

## 2020-04-26 DIAGNOSIS — M25611 Stiffness of right shoulder, not elsewhere classified: Secondary | ICD-10-CM | POA: Diagnosis not present

## 2020-04-27 DIAGNOSIS — X32XXXA Exposure to sunlight, initial encounter: Secondary | ICD-10-CM | POA: Diagnosis not present

## 2020-04-27 DIAGNOSIS — Z08 Encounter for follow-up examination after completed treatment for malignant neoplasm: Secondary | ICD-10-CM | POA: Diagnosis not present

## 2020-04-27 DIAGNOSIS — Z85828 Personal history of other malignant neoplasm of skin: Secondary | ICD-10-CM | POA: Diagnosis not present

## 2020-04-27 DIAGNOSIS — L570 Actinic keratosis: Secondary | ICD-10-CM | POA: Diagnosis not present

## 2020-04-27 DIAGNOSIS — L821 Other seborrheic keratosis: Secondary | ICD-10-CM | POA: Diagnosis not present

## 2020-04-29 DIAGNOSIS — M25511 Pain in right shoulder: Secondary | ICD-10-CM | POA: Diagnosis not present

## 2020-04-29 DIAGNOSIS — M25611 Stiffness of right shoulder, not elsewhere classified: Secondary | ICD-10-CM | POA: Diagnosis not present

## 2020-05-03 DIAGNOSIS — M25511 Pain in right shoulder: Secondary | ICD-10-CM | POA: Diagnosis not present

## 2020-05-03 DIAGNOSIS — M25611 Stiffness of right shoulder, not elsewhere classified: Secondary | ICD-10-CM | POA: Diagnosis not present

## 2020-05-06 DIAGNOSIS — M25511 Pain in right shoulder: Secondary | ICD-10-CM | POA: Diagnosis not present

## 2020-05-06 DIAGNOSIS — M25611 Stiffness of right shoulder, not elsewhere classified: Secondary | ICD-10-CM | POA: Diagnosis not present

## 2020-05-10 DIAGNOSIS — M25611 Stiffness of right shoulder, not elsewhere classified: Secondary | ICD-10-CM | POA: Diagnosis not present

## 2020-05-10 DIAGNOSIS — R69 Illness, unspecified: Secondary | ICD-10-CM | POA: Diagnosis not present

## 2020-05-10 DIAGNOSIS — M25511 Pain in right shoulder: Secondary | ICD-10-CM | POA: Diagnosis not present

## 2020-05-12 ENCOUNTER — Ambulatory Visit: Payer: Self-pay

## 2020-05-12 DIAGNOSIS — E785 Hyperlipidemia, unspecified: Secondary | ICD-10-CM

## 2020-05-12 DIAGNOSIS — I10 Essential (primary) hypertension: Secondary | ICD-10-CM

## 2020-05-12 NOTE — Chronic Care Management (AMB) (Signed)
Chronic Care Management   Follow Up Note   05/12/2020 Name: Carmen Clarke MRN: [id number] DOB: 07/22/1951  Primary Care Provider: Delsa Grana, PA-C Reason for referral : Chronic Care Management  Carmen Clarke is a 69 y.o. year old female who is a primary care patient of Delsa Grana, [place]. A routine telephonic outreach was conducted today. She has met her care management goals.  Review of Carmen Clarke's status, including review of consultants reports, relevant labs and test results was conducted today. Collaboration with appropriate care team members was performed as part of the comprehensive evaluation and provision of chronic care management services.    SDOH (Social Determinants of Health) assessments performed: No    Outpatient Encounter Medications as of 05/12/2020  Medication Sig   aspirin EC 81 MG tablet Take 81 mg by mouth daily.   Calcium Carbonate-Vit D-Min (CALCIUM 1200) 1200-1000 MG-UNIT CHEW Chew 1 capsule by mouth daily.   famotidine (PEPCID) 20 MG tablet Take 20 mg by mouth 2 (two) times daily. (Patient not taking: Reported on 02/23/2020)   lisinopril-hydrochlorothiazide (ZESTORETIC) 10-12.5 MG tablet Take 1 tablet by mouth daily.   pantoprazole (PROTONIX) 40 MG tablet Take 1 tablet (40 mg total) by mouth daily. (Patient not taking: Reported on 02/23/2020)   simvastatin (ZOCOR) 20 MG tablet Take 1 tablet (20 mg total) by mouth at bedtime.   No facility-administered encounter medications on file as of 05/12/2020.     Goals Addressed            This Visit's Progress    COMPLETED: Chronic Disease Management       CARE PLAN ENTRY (see longitudinal plan of care for additional care plan information)  Current Barriers:   Chronic Disease Management support and education needs related to Hypertension and Hyperlipidemia.  Case Manager Clinical Goal(s):  Over the next 30 days, patient will:  Not experience complications following Rotator Cuff  repair.-Complete Over the next 120 days, patient will:  Take all medications as prescribed.-Complete  Attend all medical appointments as scheduled.-Complete  Continue adhering to recommended cardiac prudent/heart healthy diet.-Complete  Monitor blood pressure and record readings.-Complete  Follow recommended safety measures to prevent falls and injuries.-Complete    Interventions:   Inter-disciplinary care team collaboration (see longitudinal plan of care)  Discussed plans care management and pending relocation. Carmen Clarke plans to be in [place] for a few weeks in October and will permanently relocate in December. She has not experienced complications following the rotator cuff repair. Attends physical therapy twice a week. She reports improvement with shoulder movement but does note concerns with long term pain control. Reports very little pain relief with the prescribed pain medications and reports the physical therapy team recently recommended dry needling. She will consider this option and keep the PT and Ortho team updated with her preference. She will follow up with Dr. Harlow Mares on 05/25/20. Otherwise reports doing well. Her main focus is the upcoming move. She doesn't anticipate additional care management needs but will require a refill of her maintenance medications until she establishes care in [place]. She agreed to contact her insurance provider prior to relocating to discuss requirements and available network providers. She is scheduled for a clinic visit with her PCP on 05/26/20. Agreed to contact the care management team if additional assistance is needed.     Patient Self Care Activities:   Self administers medications   Attends scheduled provider appointments  Calls pharmacy for medication refills  Performs ADL's independently  Performs IADL's  independently  Calls provider office for new concerns or questions   Please see past updates related to this goal by  clicking on the "Past Updates" button in the selected goal          PLAN [name] will contact the care management team if assistance is needed prior to her relocating.    Carmen Clarke [facility] Care Management [facility] [phone]

## 2020-05-13 DIAGNOSIS — M25611 Stiffness of right shoulder, not elsewhere classified: Secondary | ICD-10-CM | POA: Diagnosis not present

## 2020-05-13 DIAGNOSIS — M25511 Pain in right shoulder: Secondary | ICD-10-CM | POA: Diagnosis not present

## 2020-05-14 NOTE — Patient Instructions (Signed)
Thank you for allowing the Chronic Care Management team to participate in your care.  Goals Addressed            This Visit's Progress   . COMPLETED: Chronic Disease Management       CARE PLAN ENTRY (see longitudinal plan of care for additional care plan information)  Current Barriers:  . Chronic Disease Management support and education needs related to Hypertension and Hyperlipidemia.  Case Manager Clinical Goal(s):  Over the next 30 days, patient will: . Not experience complications following Rotator Cuff repair.-Complete Over the next 120 days, patient will: . Take all medications as prescribed.-Complete . Attend all medical appointments as scheduled.-Complete . Continue adhering to recommended cardiac prudent/heart healthy diet.-Complete . Monitor blood pressure and record readings.-Complete . Follow recommended safety measures to prevent falls and injuries.-Complete    Interventions:  . Inter-disciplinary care team collaboration (see longitudinal plan of care) . Discussed plans care management and pending relocation. Carmen Clarke plans to be in [place] for a few weeks in October and will permanently relocate in December. She has not experienced complications following the rotator cuff repair. Attends physical therapy twice a week. She reports improvement with shoulder movement but does note concerns with long term pain control. Reports very little pain relief with the prescribed pain medications and reports the physical therapy team recently recommended dry needling. She will consider this option and keep the PT and Ortho team updated with her preference. She will follow up with Dr. Harlow Mares on 05/25/20. Otherwise reports doing well. Her main focus is the upcoming move. She doesn't anticipate additional care management needs but will require a refill of her maintenance medications until she establishes care in [place]. She agreed to contact her insurance provider prior to relocating  to discuss requirements and available network providers. She is scheduled for a clinic visit with her PCP on 05/26/20. Agreed to contact the care management team if additional assistance is needed.     Patient Self Care Activities:  . Self administers medications  . Attends scheduled provider appointments . Calls pharmacy for medication refills . Performs ADL's independently . Performs IADL's independently . Calls provider office for new concerns or questions   Please see past updates related to this goal by clicking on the "Past Updates" button in the selected goal        Carmen Clarke verbalized understanding of the information discussed during the telephonic outreach today. Declined need for printed/mailed information.    Carmen Clarke will contact the care management team if assistance is needed prior to her relocating.    Carmen Clarke [facility] Care Management [facility] [phone]

## 2020-05-18 DIAGNOSIS — M25611 Stiffness of right shoulder, not elsewhere classified: Secondary | ICD-10-CM | POA: Diagnosis not present

## 2020-05-18 DIAGNOSIS — M25511 Pain in right shoulder: Secondary | ICD-10-CM | POA: Diagnosis not present

## 2020-05-20 DIAGNOSIS — M25611 Stiffness of right shoulder, not elsewhere classified: Secondary | ICD-10-CM | POA: Diagnosis not present

## 2020-05-20 DIAGNOSIS — M25511 Pain in right shoulder: Secondary | ICD-10-CM | POA: Diagnosis not present

## 2020-05-21 DIAGNOSIS — M25611 Stiffness of right shoulder, not elsewhere classified: Secondary | ICD-10-CM | POA: Diagnosis not present

## 2020-05-21 DIAGNOSIS — M25511 Pain in right shoulder: Secondary | ICD-10-CM | POA: Diagnosis not present

## 2020-05-25 NOTE — Progress Notes (Signed)
Name: Carmen Clarke   MRN: [id number]    DOB: [birth year]   Date:05/25/2020       Progress Note  Chief Complaint  Patient presents with   Hyperlipidemia   Hypertension     Subjective:   Carmen Clarke is a 69 y.o. female, presents to clinic for routine f/up  Hypertension:  Currently managed on lisinopril-HCTZ 10-12.5 Pt reports good med compliance and denies any SE.   Blood pressure today is well controlled. BP Readings from Last 3 Encounters:  05/26/20 112/72  01/19/20 123/74  01/16/20 132/84   Pt denies CP, SOB, exertional sx, LE edema, palpitation, Ha's, visual disturbances, lightheadedness, hypotension, syncope. Dietary efforts for BP?  Tries to be healthy and avoid salt   Hyperlipidemia: Currently treated with simvastatin 20 mg, pt reports good med compliance Last Lipids: 07/2019, well controlled, no med changes since then, LFTs have been normal - Denies: Chest pain, shortness of breath, myalgias, claudication  GERD:  Was on protonix and pepcid, not currently on any medications and sx well controlled, she consulted with GI and has not had any severe sx like experienced previously  She will be moving to [place] soon - she would like to do her mammogram testing there  Due for mammogram in Jan 2022, Dexa 05/2021   Current Outpatient Medications:    aspirin EC 81 MG tablet, Take 81 mg by mouth daily., Disp: , Rfl:    Calcium Carbonate-Vit D-Min (CALCIUM 1200) 1200-1000 MG-UNIT CHEW, Chew 1 capsule by mouth daily., Disp: , Rfl:    famotidine (PEPCID) 20 MG tablet, Take 20 mg by mouth 2 (two) times daily. (Patient not taking: Reported on 02/23/2020), Disp: , Rfl:    lisinopril-hydrochlorothiazide (ZESTORETIC) 10-12.5 MG tablet, Take 1 tablet by mouth daily., Disp: 90 tablet, Rfl: 3   pantoprazole (PROTONIX) 40 MG tablet, Take 1 tablet (40 mg total) by mouth daily. (Patient not taking: Reported on 02/23/2020), Disp: 30 tablet, Rfl: 0   simvastatin (ZOCOR) 20 MG  tablet, Take 1 tablet (20 mg total) by mouth at bedtime., Disp: 90 tablet, Rfl: 3  Patient Active Problem List   Diagnosis Date Noted   Bursitis of hip 08/30/2018   Full code status 01/11/2018   Fracture of patella, left, closed 03/28/2017   Medication monitoring encounter 11/09/2015   Arm pain, left 09/28/2015   Shoulder pain, left 09/28/2015   Preventative health care 05/18/2015   Overweight (BMI 25.0-29.9) 05/14/2015   Breast cancer screening 05/14/2015   Hypertension    Hyperlipidemia    Osteopenia    Vitamin D deficiency disease     Past Surgical History:  Procedure Laterality Date   COLONOSCOPY  2014   FEMUR FRACTURE SURGERY     FOOT SURGERY  Aug. 2016   HIP FRACTURE SURGERY     knee arthoscopy Left    partial medial and lateral meniscetomy with removal of deep hardware from left distal femur    Family History  Problem Relation Age of Onset   Diabetes Mother    Hypertension Mother    Hypertension Father    Cancer Neg Hx    COPD Neg Hx    Heart disease Neg Hx    Stroke Neg Hx     Social History   Tobacco Use   Smoking status: Never Smoker   Smokeless tobacco: Never Used  Vaping Use   Vaping Use: Never used  Substance Use Topics   Alcohol use: No    Alcohol/week: 0.0 standard drinks  Drug use: No     No Known Allergies  Health Maintenance  Topic Date Due   MAMMOGRAM  08/28/2020   DEXA SCAN  06/12/2021   COLONOSCOPY  09/09/2022   TETANUS/TDAP  05/13/2025   INFLUENZA VACCINE  Completed   COVID-19 Vaccine  Completed   PNA vac Low Risk Adult  Completed   Hepatitis C Screening  Addressed    Chart Review Today: I personally reviewed active problem list, medication list, allergies, family history, social history, health maintenance, notes from last encounter, lab results, imaging with the patient/caregiver today.   Review of Systems  10 Systems reviewed and are negative for acute change except as noted in the  HPI.  Objective:   Vitals:   05/26/20 1025  BP: 112/72  Pulse: 74  Resp: 16  Temp: 98.3 F (36.8 C)  TempSrc: Oral  SpO2: 99%  Weight: 141 lb 12.8 oz (64.3 kg)  Height: 5\' 2"  (1.575 m)    Body mass index is 25.94 kg/m.  Physical Exam Vitals and nursing note reviewed.  Constitutional:      General: She is not in acute distress.    Appearance: Normal appearance. She is well-developed and normal weight. She is not ill-appearing, toxic-appearing or diaphoretic.     Interventions: Face mask in place.  HENT:     Head: Normocephalic and atraumatic.     Right Ear: External ear normal.     Left Ear: External ear normal.  Eyes:     General: Lids are normal. No scleral icterus.       Right eye: No discharge.        Left eye: No discharge.     Conjunctiva/sclera: Conjunctivae normal.  Neck:     Trachea: Phonation normal. No tracheal deviation.  Cardiovascular:     Rate and Rhythm: Normal rate and regular rhythm.     Pulses: Normal pulses.          Radial pulses are 2+ on the right side and 2+ on the left side.       Posterior tibial pulses are 2+ on the right side and 2+ on the left side.     Heart sounds: Normal heart sounds. No murmur heard.  No friction rub. No gallop.   Pulmonary:     Effort: Pulmonary effort is normal. No respiratory distress.     Breath sounds: Normal breath sounds. No stridor. No wheezing, rhonchi or rales.  Chest:     Chest wall: No tenderness.  Abdominal:     General: Bowel sounds are normal. There is no distension.     Palpations: Abdomen is soft.  Musculoskeletal:     Right lower leg: No edema.     Left lower leg: No edema.  Skin:    General: Skin is warm and dry.     Coloration: Skin is not jaundiced or pale.     Findings: No rash.  Neurological:     Mental Status: She is alert.     Motor: No abnormal muscle tone.     Gait: Gait normal.  Psychiatric:        Mood and Affect: Mood normal.        Speech: Speech normal.        Behavior:  Behavior normal.         Assessment & Plan:     ICD-10-CM   1. Essential hypertension  C78 COMPLETE METABOLIC PANEL WITH GFR    lisinopril-hydrochlorothiazide (ZESTORETIC) 10-12.5 MG tablet  stable and well controlled on lisinopril-HCTZ 10-12.5  2. Hyperlipidemia, unspecified hyperlipidemia type  E78.5 Lipid panel    COMPLETE METABOLIC PANEL WITH GFR   compliant with statin, has been well controlled with simvastatin, no SE or concerns, due for repeat lipids and CMP, continue meds and diet/lifestyle efforts  3. Gastroesophageal reflux disease, unspecified whether esophagitis present  K21.9    no sx, got off all meds  4. Vitamin D deficiency disease  D80.0 COMPLETE METABOLIC PANEL WITH GFR   continue daily supplement   5. Osteopenia of spine  Y34.94 COMPLETE METABOLIC PANEL WITH GFR   continue calcium and vit d supplement and weight bearing exercise, f/up dexa due 05/2021  6. Overweight (BMI 25.0-29.9)  E66.3   7. Encounter for medication monitoring  Z51.81 Lipid panel    COMPLETE METABOLIC PANEL WITH GFR    CBC with Differential/Platelet   Reviewed all HM and meds with pt - trying to help her have everything she needs while moving to [place] Provided on AVS - additionally offered to help get refills or her HM screening ordered in the first few months of while in [place] while she gets est with local PCP (See AVS)  Return in about 6 months (around 11/24/2020) for Routine follow-up.   Delsa Grana, PA-C 05/25/20 1:48 PM

## 2020-05-26 ENCOUNTER — Encounter: Payer: Self-pay | Admitting: Family Medicine

## 2020-05-26 ENCOUNTER — Ambulatory Visit (INDEPENDENT_AMBULATORY_CARE_PROVIDER_SITE_OTHER): Payer: Medicare HMO | Admitting: Family Medicine

## 2020-05-26 ENCOUNTER — Other Ambulatory Visit: Payer: Self-pay

## 2020-05-26 VITALS — BP 112/72 | HR 74 | Temp 98.3°F | Resp 16 | Ht 62.0 in | Wt 141.8 lb

## 2020-05-26 DIAGNOSIS — Z5181 Encounter for therapeutic drug level monitoring: Secondary | ICD-10-CM

## 2020-05-26 DIAGNOSIS — M8588 Other specified disorders of bone density and structure, other site: Secondary | ICD-10-CM

## 2020-05-26 DIAGNOSIS — E559 Vitamin D deficiency, unspecified: Secondary | ICD-10-CM

## 2020-05-26 DIAGNOSIS — E663 Overweight: Secondary | ICD-10-CM

## 2020-05-26 DIAGNOSIS — I10 Essential (primary) hypertension: Secondary | ICD-10-CM

## 2020-05-26 DIAGNOSIS — K219 Gastro-esophageal reflux disease without esophagitis: Secondary | ICD-10-CM | POA: Diagnosis not present

## 2020-05-26 DIAGNOSIS — E785 Hyperlipidemia, unspecified: Secondary | ICD-10-CM | POA: Diagnosis not present

## 2020-05-26 DIAGNOSIS — M25511 Pain in right shoulder: Secondary | ICD-10-CM | POA: Diagnosis not present

## 2020-05-26 DIAGNOSIS — M25611 Stiffness of right shoulder, not elsewhere classified: Secondary | ICD-10-CM | POA: Diagnosis not present

## 2020-05-26 MED ORDER — LISINOPRIL-HYDROCHLOROTHIAZIDE 10-12.5 MG PO TABS: 1.0000 | ORAL_TABLET | Freq: Every day | ORAL | 3 refills | Status: AC

## 2020-05-26 NOTE — Patient Instructions (Addendum)
Health Maintenance  Topic Date Due  . Mammogram  08/28/2020  . DEXA scan (bone density measurement)  06/12/2021  . Colon Cancer Screening  09/09/2022  . Tetanus Vaccine  05/13/2025  . Flu Shot  Completed  . COVID-19 Vaccine  Completed  . Pneumonia vaccines  Completed  .  Hepatitis C: One time screening is recommended by Center for Disease Control  (CDC) for  adults born from [birth year] through 1965.   Addressed   Let me know if the imaging center in [place] accepts orders from providers out of state? Get their info and fax number and we can send orders if they allow Delsa Grana PA-C NPI [phone]   Refills sent in on your blood pressure medications and cholesterol medications  If you would like me to send new refills to [facility] when you move let me know and I would be able to send in about 6 months worth of medications and allow you time to get established with a new PCP in [place]

## 2020-05-27 LAB — CBC WITH DIFFERENTIAL/PLATELET
Absolute Monocytes: 681 cells/uL (ref 200–950)
Basophils Absolute: 42 cells/uL (ref 0–200)
Basophils Relative: 0.5 %
Eosinophils Absolute: 257 cells/uL (ref 15–500)
Eosinophils Relative: 3.1 %
HCT: 42.6 % (ref 35.0–45.0)
Hemoglobin: 14.4 g/dL (ref 11.7–15.5)
Lymphs Abs: 2805 cells/uL (ref 850–3900)
MCH: 31.7 pg (ref 27.0–33.0)
MCHC: 33.8 g/dL (ref 32.0–36.0)
MCV: 93.8 fL (ref 80.0–100.0)
MPV: 10.9 fL (ref 7.5–12.5)
Monocytes Relative: 8.2 %
Neutro Abs: 4515 cells/uL (ref 1500–7800)
Neutrophils Relative %: 54.4 %
Platelets: 313 10*3/uL (ref 140–400)
RBC: 4.54 10*6/uL (ref 3.80–5.10)
RDW: 12.2 % (ref 11.0–15.0)
Total Lymphocyte: 33.8 %
WBC: 8.3 10*3/uL (ref 3.8–10.8)

## 2020-05-27 LAB — COMPLETE METABOLIC PANEL WITH GFR
AG Ratio: 2 (calc) (ref 1.0–2.5)
ALT: 20 U/L (ref 6–29)
AST: 21 U/L (ref 10–35)
Albumin: 4.5 g/dL (ref 3.6–5.1)
Alkaline phosphatase (APISO): 90 U/L (ref 37–153)
BUN: 20 mg/dL (ref 7–25)
CO2: 30 mmol/L (ref 20–32)
Calcium: 9.8 mg/dL (ref 8.6–10.4)
Chloride: 103 mmol/L (ref 98–110)
Creat: 0.72 mg/dL (ref 0.50–0.99)
GFR, Est African American: 99 mL/min/{1.73_m2} (ref >=60)
GFR, Est Non African American: 85 mL/min/{1.73_m2} (ref >=60)
Globulin: 2.3 g/dL (calc) (ref 1.9–3.7)
Glucose, Bld: 86 mg/dL (ref 65–99)
Potassium: 4.7 mmol/L (ref 3.5–5.3)
Sodium: 140 mmol/L (ref 135–146)
Total Bilirubin: 0.4 mg/dL (ref 0.2–1.2)
Total Protein: 6.8 g/dL (ref 6.1–8.1)

## 2020-05-27 LAB — LIPID PANEL
Cholesterol: 182 mg/dL (ref <200)
HDL: 80 mg/dL (ref >=50)
LDL Cholesterol (Calc): 78 mg/dL (calc)
Non-HDL Cholesterol (Calc): 102 mg/dL (calc) (ref <130)
Total CHOL/HDL Ratio: 2.3 (calc) (ref <5.0)
Triglycerides: 139 mg/dL (ref <150)

## 2020-05-28 DIAGNOSIS — M25611 Stiffness of right shoulder, not elsewhere classified: Secondary | ICD-10-CM | POA: Diagnosis not present

## 2020-05-28 DIAGNOSIS — M25511 Pain in right shoulder: Secondary | ICD-10-CM | POA: Diagnosis not present

## 2020-06-21 ENCOUNTER — Encounter: Payer: Self-pay | Admitting: Family Medicine

## 2020-06-22 DIAGNOSIS — M25611 Stiffness of right shoulder, not elsewhere classified: Secondary | ICD-10-CM | POA: Diagnosis not present

## 2020-06-22 DIAGNOSIS — M25511 Pain in right shoulder: Secondary | ICD-10-CM | POA: Diagnosis not present

## 2020-06-25 DIAGNOSIS — M25511 Pain in right shoulder: Secondary | ICD-10-CM | POA: Diagnosis not present

## 2020-06-25 DIAGNOSIS — M25611 Stiffness of right shoulder, not elsewhere classified: Secondary | ICD-10-CM | POA: Diagnosis not present

## 2020-06-28 DIAGNOSIS — M25611 Stiffness of right shoulder, not elsewhere classified: Secondary | ICD-10-CM | POA: Diagnosis not present

## 2020-06-28 DIAGNOSIS — M25511 Pain in right shoulder: Secondary | ICD-10-CM | POA: Diagnosis not present

## 2020-06-30 DIAGNOSIS — M25511 Pain in right shoulder: Secondary | ICD-10-CM | POA: Diagnosis not present

## 2020-06-30 DIAGNOSIS — M25611 Stiffness of right shoulder, not elsewhere classified: Secondary | ICD-10-CM | POA: Diagnosis not present

## 2020-07-02 DIAGNOSIS — M25511 Pain in right shoulder: Secondary | ICD-10-CM | POA: Diagnosis not present

## 2020-07-02 DIAGNOSIS — M25611 Stiffness of right shoulder, not elsewhere classified: Secondary | ICD-10-CM | POA: Diagnosis not present

## 2020-07-05 DIAGNOSIS — M25611 Stiffness of right shoulder, not elsewhere classified: Secondary | ICD-10-CM | POA: Diagnosis not present

## 2020-07-05 DIAGNOSIS — M25511 Pain in right shoulder: Secondary | ICD-10-CM | POA: Diagnosis not present

## 2020-07-07 DIAGNOSIS — M25511 Pain in right shoulder: Secondary | ICD-10-CM | POA: Diagnosis not present

## 2020-07-07 DIAGNOSIS — M25611 Stiffness of right shoulder, not elsewhere classified: Secondary | ICD-10-CM | POA: Diagnosis not present

## 2020-07-12 DIAGNOSIS — M25611 Stiffness of right shoulder, not elsewhere classified: Secondary | ICD-10-CM | POA: Diagnosis not present

## 2020-07-12 DIAGNOSIS — M25511 Pain in right shoulder: Secondary | ICD-10-CM | POA: Diagnosis not present

## 2020-07-14 DIAGNOSIS — M25511 Pain in right shoulder: Secondary | ICD-10-CM | POA: Diagnosis not present

## 2020-07-14 DIAGNOSIS — M25611 Stiffness of right shoulder, not elsewhere classified: Secondary | ICD-10-CM | POA: Diagnosis not present

## 2020-07-16 DIAGNOSIS — M25511 Pain in right shoulder: Secondary | ICD-10-CM | POA: Diagnosis not present

## 2020-07-16 DIAGNOSIS — M25611 Stiffness of right shoulder, not elsewhere classified: Secondary | ICD-10-CM | POA: Diagnosis not present

## 2020-09-06 ENCOUNTER — Telehealth: Payer: Self-pay | Admitting: Family Medicine

## 2020-09-06 NOTE — Telephone Encounter (Signed)
Patient declined the Medicare Wellness Visit with [facility] due t Patient has moved to [place]

## 2021-09-04 IMAGING — CT CT RENAL STONE PROTOCOL
2 of 4 series · 15 of 46 positions shown, 17 images · non-contrast
Comparison: None.

CLINICAL DATA: Left-sided chest and flank pain since yesterday.

EXAM:
CT ABDOMEN AND PELVIS WITHOUT CONTRAST
TECHNIQUE: Multidetector CT imaging of the abdomen and pelvis was performed
following the standard protocol without IV contrast.

[Series 2: stone full standard · axial · 0.70mm/px · z∈[-922,-502]mm · 12 of 92 slices shown, 14 images]
[im 4/92  soft-tissue]
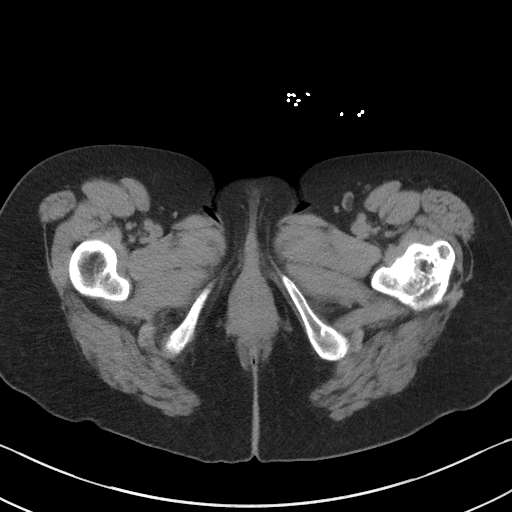
[im 4/92  bone]
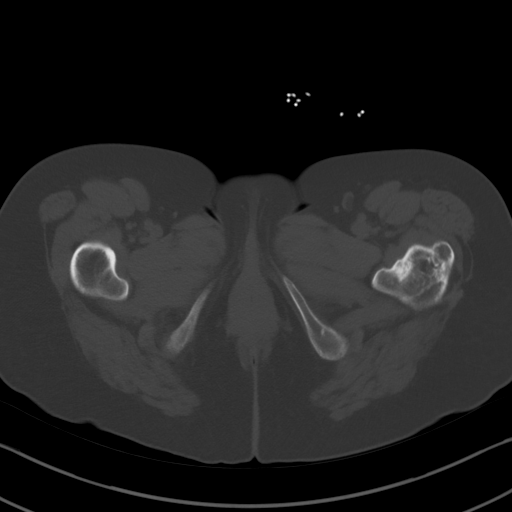
[im 12/92  soft-tissue]
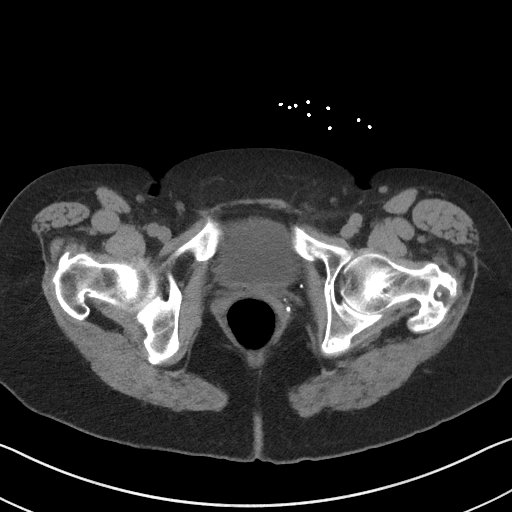
[im 20/92  soft-tissue]
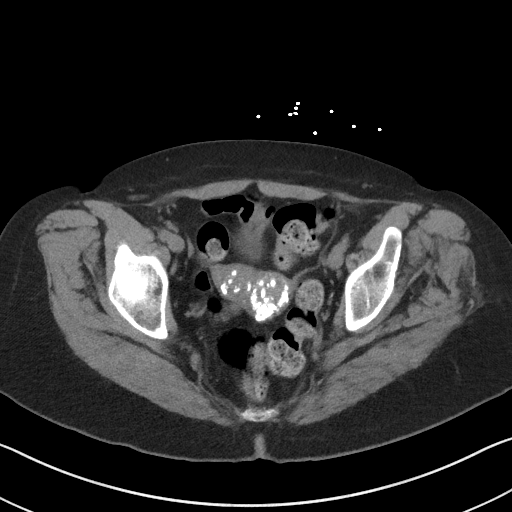
[im 28/92  soft-tissue]
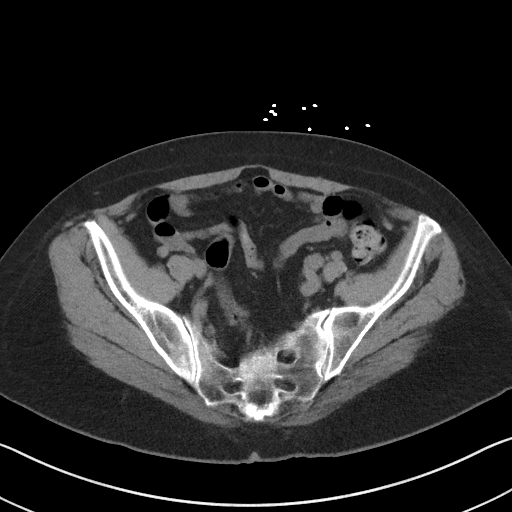
[im 36/92  soft-tissue]
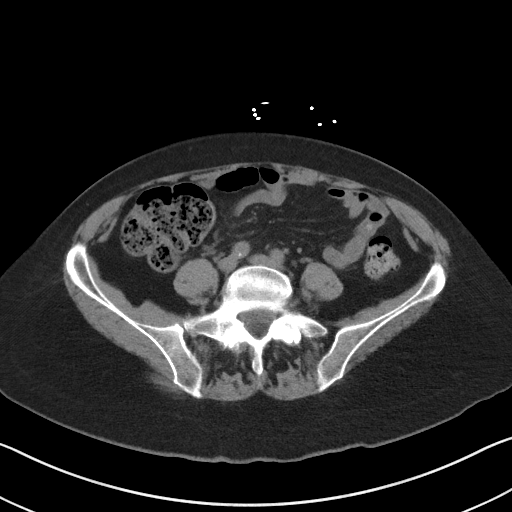
[im 44/92  soft-tissue]
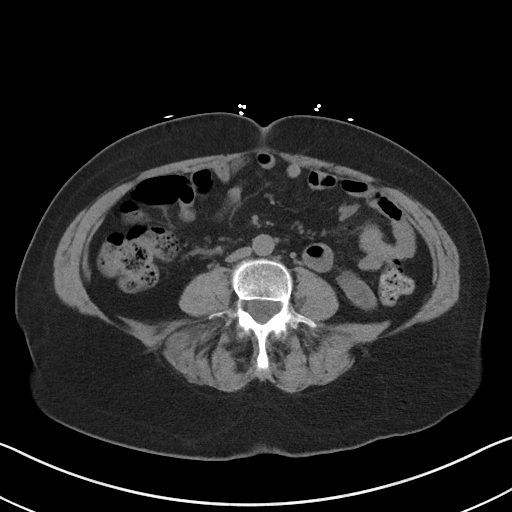
[im 48/92  soft-tissue]
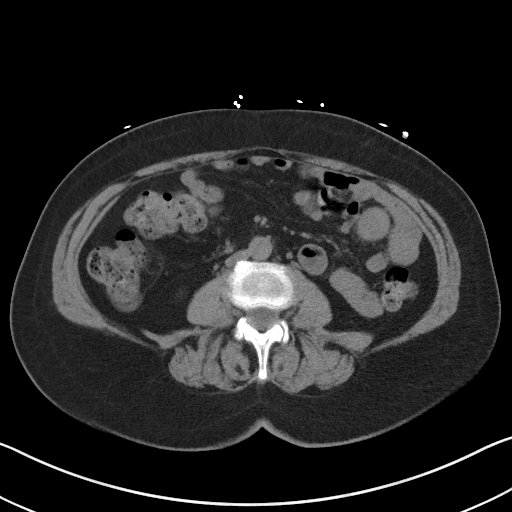
[im 56/92  soft-tissue]
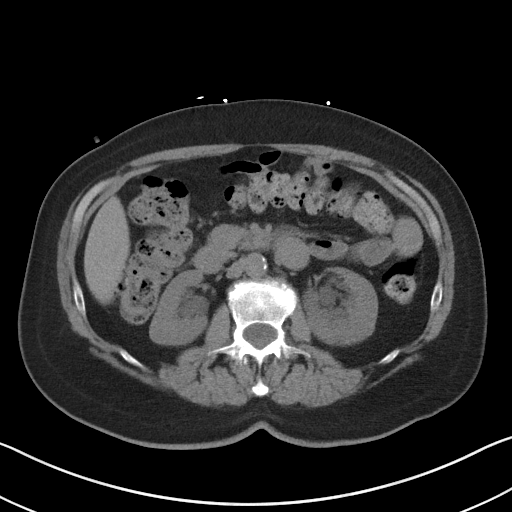
[im 64/92  soft-tissue]
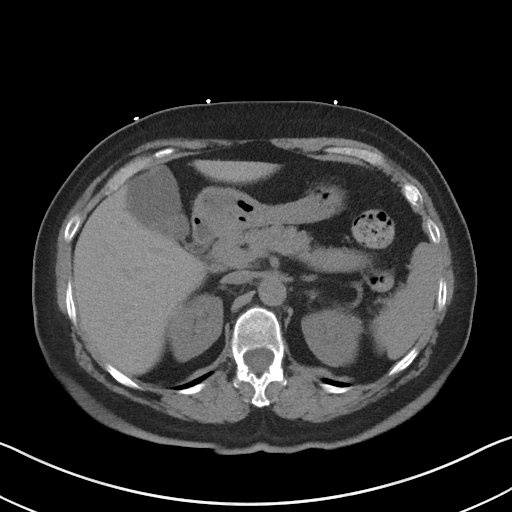
[im 64/92  bone]
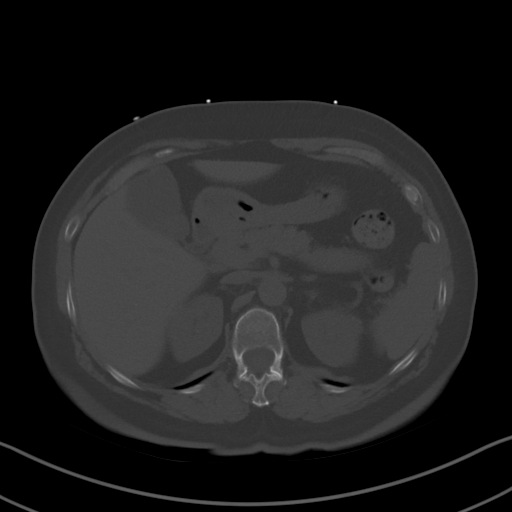
[im 72/92  soft-tissue]
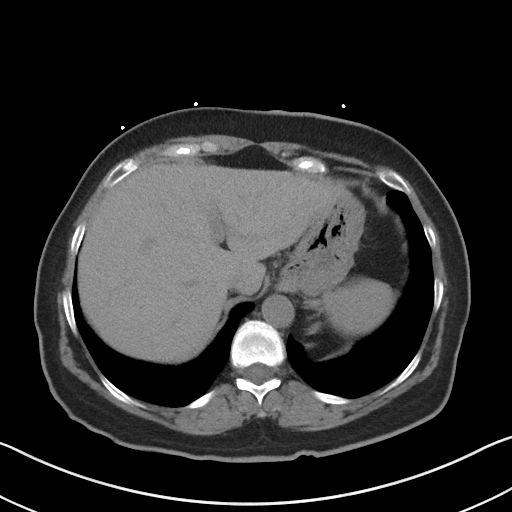
[im 80/92  soft-tissue]
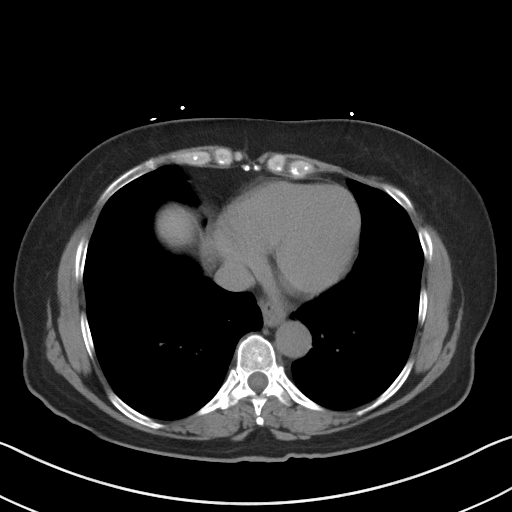
[im 88/92  soft-tissue]
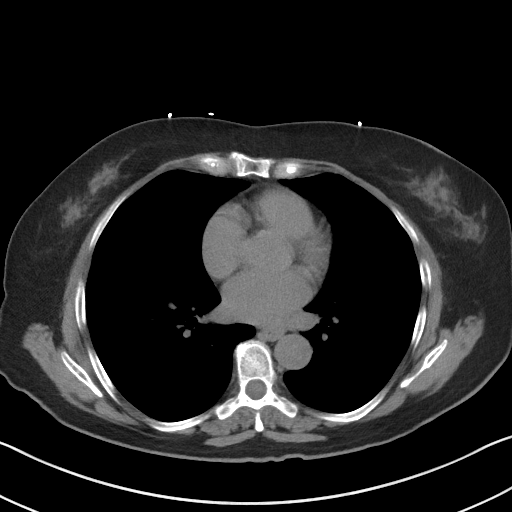

[Series 5: coronal · coronal · 0.67mm/px · 3 of 126 slices shown]
[im 42/126  soft-tissue]
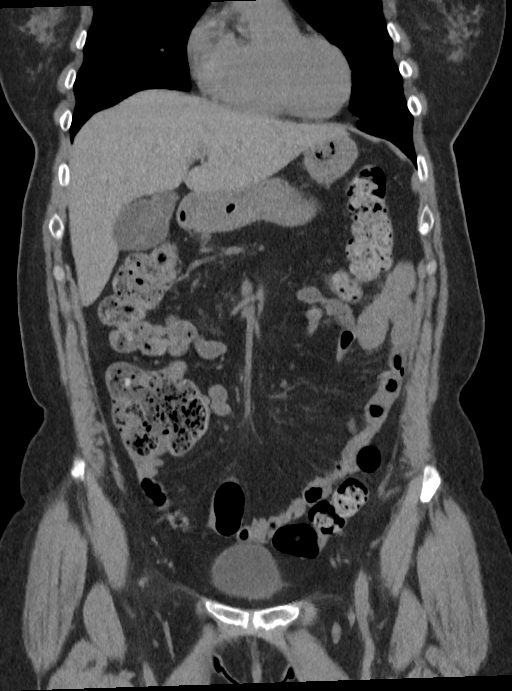
[im 56/126  soft-tissue]
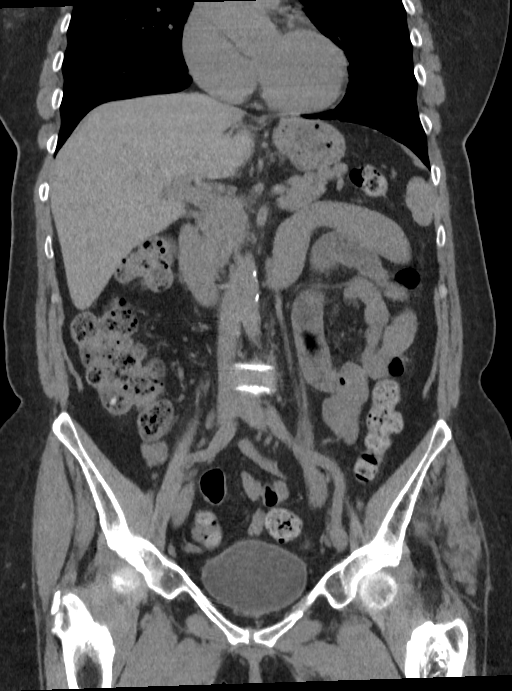
[im 70/126  soft-tissue]
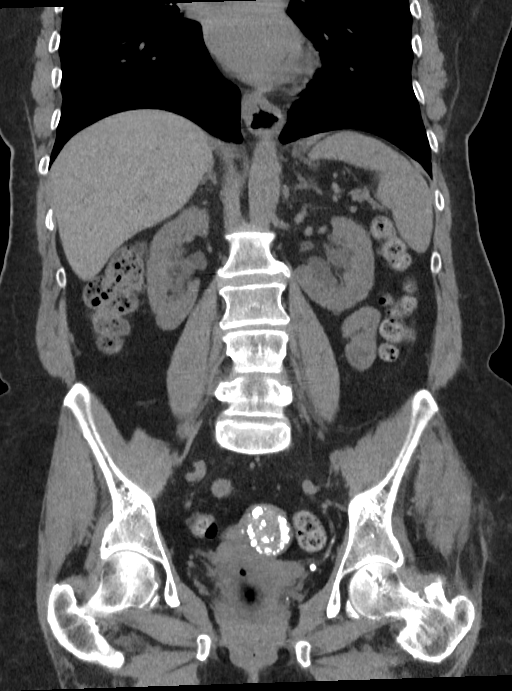

[15 of 46 positions shown; findings below may reference images not displayed]

FINDINGS: Lower chest: The lung bases are clear of acute process. No pleural
effusion or pulmonary lesions. The heart is normal in size. No
pericardial effusion. The distal descending thoracic aorta is
unremarkable. There is a small hiatal hernia noted.

Hepatobiliary: No hepatic lesions are identified without contrast.
There is layering higher attenuation material in the dependent
portion the gallbladder which could be gallstones or sludge. No
findings for acute cholecystitis. No common bile duct dilatation.

Pancreas: No mass, inflammation or ductal dilatation. Small focal
fatty cleft noted in the pancreatic head.

Spleen: Normal size. No focal lesions.

Adrenals/Urinary Tract: The adrenal glands are unremarkable.

Suspect bilateral parapelvic renal cysts. No renal, ureteral or
bladder calculi are identified. No obvious renal or bladder lesions
are identified without contrast.

Stomach/Bowel: The stomach, duodenum, small bowel and colon are
unremarkable without contrast. No acute inflammatory changes, mass
lesions or obstructive findings. The terminal ileum is normal. The
appendix is not identified.

Vascular/Lymphatic: The aorta is normal in caliber. Minimal
scattered atheroscerlotic calcifications. No mesenteric of
retroperitoneal mass or adenopathy. Small scattered lymph nodes are
noted.

Reproductive: Calcified uterine fibroids are noted. The ovaries are
unremarkable.

Other: No pelvic mass or adenopathy. No free pelvic fluid
collections. No inguinal mass or adenopathy. No abdominal wall
hernia or subcutaneous lesions.

Musculoskeletal: No acute bony findings or worrisome bone lesions.
Remote posttraumatic changes involving the left hip.
IMPRESSION: 1. No renal, ureteral or bladder calculi or mass.
2. Suspect bilateral parapelvic renal cysts.
3. No acute abdominal/pelvic findings, mass lesions or adenopathy.
4. Calcified uterine fibroids.
